# Patient Record
Sex: Male | Born: 1935 | Race: Black or African American | Hispanic: No | Marital: Married | Smoking: Former smoker
Health system: Southern US, Community
[De-identification: ages and names within clinical notes are randomized; demographics above are authoritative.]

## PROBLEM LIST (undated history)

## (undated) DIAGNOSIS — F0390 Unspecified dementia without behavioral disturbance: Secondary | ICD-10-CM

## (undated) DIAGNOSIS — F419 Anxiety disorder, unspecified: Secondary | ICD-10-CM

## (undated) DIAGNOSIS — K259 Gastric ulcer, unspecified as acute or chronic, without hemorrhage or perforation: Secondary | ICD-10-CM

## (undated) DIAGNOSIS — I10 Essential (primary) hypertension: Secondary | ICD-10-CM

## (undated) HISTORY — DX: Anxiety disorder, unspecified: F41.9

## (undated) HISTORY — PX: NO PAST SURGERIES: SHX2092

## (undated) HISTORY — PX: INSERTION PROSTATE RADIATION SEED: SUR718

## (undated) HISTORY — PX: MOUTH SURGERY: SHX715

## (undated) HISTORY — DX: Gastric ulcer, unspecified as acute or chronic, without hemorrhage or perforation: K25.9

---

## 1998-07-09 ENCOUNTER — Inpatient Hospital Stay (HOSPITAL_COMMUNITY): Admission: EM | Admit: 1998-07-09 | Discharge: 1998-07-11 | Payer: Self-pay | Admitting: Emergency Medicine

## 1998-07-09 ENCOUNTER — Encounter: Payer: Self-pay | Admitting: Emergency Medicine

## 1999-03-17 ENCOUNTER — Encounter: Payer: Self-pay | Admitting: Internal Medicine

## 1999-03-17 ENCOUNTER — Ambulatory Visit (HOSPITAL_COMMUNITY): Admission: RE | Admit: 1999-03-17 | Discharge: 1999-03-17 | Payer: Self-pay | Admitting: Internal Medicine

## 2003-12-23 ENCOUNTER — Ambulatory Visit: Payer: Self-pay | Admitting: Internal Medicine

## 2004-12-03 ENCOUNTER — Ambulatory Visit: Payer: Self-pay | Admitting: Internal Medicine

## 2004-12-25 ENCOUNTER — Ambulatory Visit: Payer: Self-pay | Admitting: Internal Medicine

## 2005-02-22 ENCOUNTER — Ambulatory Visit: Payer: Self-pay | Admitting: Internal Medicine

## 2005-12-01 ENCOUNTER — Ambulatory Visit: Payer: Self-pay | Admitting: Internal Medicine

## 2006-03-02 ENCOUNTER — Ambulatory Visit: Payer: Self-pay | Admitting: Internal Medicine

## 2006-04-06 ENCOUNTER — Ambulatory Visit: Payer: Self-pay | Admitting: Internal Medicine

## 2006-04-11 ENCOUNTER — Ambulatory Visit: Payer: Self-pay | Admitting: Internal Medicine

## 2006-06-06 ENCOUNTER — Ambulatory Visit: Payer: Self-pay | Admitting: Internal Medicine

## 2006-11-28 ENCOUNTER — Ambulatory Visit: Payer: Self-pay | Admitting: Internal Medicine

## 2006-11-29 DIAGNOSIS — I10 Essential (primary) hypertension: Secondary | ICD-10-CM | POA: Insufficient documentation

## 2006-11-29 DIAGNOSIS — F411 Generalized anxiety disorder: Secondary | ICD-10-CM | POA: Insufficient documentation

## 2006-11-29 DIAGNOSIS — Z8711 Personal history of peptic ulcer disease: Secondary | ICD-10-CM

## 2006-11-29 DIAGNOSIS — Z8546 Personal history of malignant neoplasm of prostate: Secondary | ICD-10-CM | POA: Insufficient documentation

## 2006-11-29 DIAGNOSIS — M1990 Unspecified osteoarthritis, unspecified site: Secondary | ICD-10-CM | POA: Insufficient documentation

## 2006-11-29 DIAGNOSIS — Z860100 Personal history of colon polyps, unspecified: Secondary | ICD-10-CM | POA: Insufficient documentation

## 2006-11-29 DIAGNOSIS — N529 Male erectile dysfunction, unspecified: Secondary | ICD-10-CM

## 2006-11-29 DIAGNOSIS — G250 Essential tremor: Secondary | ICD-10-CM

## 2006-11-29 DIAGNOSIS — R413 Other amnesia: Secondary | ICD-10-CM | POA: Insufficient documentation

## 2006-11-29 DIAGNOSIS — G252 Other specified forms of tremor: Secondary | ICD-10-CM | POA: Insufficient documentation

## 2007-02-01 ENCOUNTER — Ambulatory Visit: Payer: Self-pay | Admitting: Internal Medicine

## 2007-02-01 DIAGNOSIS — K279 Peptic ulcer, site unspecified, unspecified as acute or chronic, without hemorrhage or perforation: Secondary | ICD-10-CM | POA: Insufficient documentation

## 2007-02-10 ENCOUNTER — Encounter: Payer: Self-pay | Admitting: Internal Medicine

## 2007-05-17 ENCOUNTER — Ambulatory Visit: Payer: Self-pay | Admitting: Internal Medicine

## 2007-05-21 LAB — CONVERTED CEMR LAB
ALT: 31 units/L (ref 0–53)
AST: 32 units/L (ref 0–37)
Albumin: 3.7 g/dL (ref 3.5–5.2)
Alkaline Phosphatase: 42 units/L (ref 39–117)
BUN: 12 mg/dL (ref 6–23)
Basophils Absolute: 0 10*3/uL (ref 0.0–0.1)
Basophils Relative: 0.4 % (ref 0.0–1.0)
Bilirubin, Direct: 0.3 mg/dL (ref 0.0–0.3)
CO2: 31 meq/L (ref 19–32)
Calcium: 9.2 mg/dL (ref 8.4–10.5)
Chloride: 107 meq/L (ref 96–112)
Creatinine, Ser: 1.2 mg/dL (ref 0.4–1.5)
Eosinophils Absolute: 0.1 10*3/uL (ref 0.0–0.7)
Eosinophils Relative: 1.1 % (ref 0.0–5.0)
GFR calc Af Amer: 74 mL/min
GFR calc non Af Amer: 61 mL/min
Glucose, Bld: 106 mg/dL — ABNORMAL HIGH (ref 70–99)
HCT: 49 % (ref 39.0–52.0)
Hemoglobin: 15.7 g/dL (ref 13.0–17.0)
Lymphocytes Relative: 35.2 % (ref 12.0–46.0)
MCHC: 32 g/dL (ref 30.0–36.0)
MCV: 91.6 fL (ref 78.0–100.0)
Monocytes Absolute: 0.7 10*3/uL (ref 0.1–1.0)
Monocytes Relative: 13.3 % — ABNORMAL HIGH (ref 3.0–12.0)
Neutro Abs: 2.4 10*3/uL (ref 1.4–7.7)
Neutrophils Relative %: 50 % (ref 43.0–77.0)
PSA: 0.03 ng/mL — ABNORMAL LOW (ref 0.10–4.00)
Platelets: 148 10*3/uL — ABNORMAL LOW (ref 150–400)
Potassium: 4.1 meq/L (ref 3.5–5.1)
RBC: 5.35 M/uL (ref 4.22–5.81)
RDW: 14.6 % (ref 11.5–14.6)
Sodium: 144 meq/L (ref 135–145)
TSH: 1.02 microintl units/mL (ref 0.35–5.50)
Total Bilirubin: 1.1 mg/dL (ref 0.3–1.2)
Total Protein: 6.9 g/dL (ref 6.0–8.3)
WBC: 4.9 10*3/uL (ref 4.5–10.5)

## 2007-05-22 ENCOUNTER — Ambulatory Visit: Payer: Self-pay | Admitting: Internal Medicine

## 2007-06-30 ENCOUNTER — Encounter: Payer: Self-pay | Admitting: Internal Medicine

## 2007-06-30 ENCOUNTER — Ambulatory Visit: Payer: Self-pay | Admitting: Vascular Surgery

## 2007-06-30 DIAGNOSIS — H349 Unspecified retinal vascular occlusion: Secondary | ICD-10-CM | POA: Insufficient documentation

## 2007-07-03 ENCOUNTER — Telehealth: Payer: Self-pay | Admitting: Internal Medicine

## 2007-07-05 ENCOUNTER — Ambulatory Visit: Payer: Self-pay

## 2007-07-05 ENCOUNTER — Encounter: Payer: Self-pay | Admitting: Internal Medicine

## 2007-07-11 ENCOUNTER — Telehealth: Payer: Self-pay | Admitting: Internal Medicine

## 2007-08-11 ENCOUNTER — Telehealth: Payer: Self-pay | Admitting: Internal Medicine

## 2007-08-31 ENCOUNTER — Ambulatory Visit: Payer: Self-pay | Admitting: Internal Medicine

## 2007-08-31 LAB — CONVERTED CEMR LAB
ALT: 24 units/L (ref 0–53)
AST: 32 units/L (ref 0–37)
Albumin: 3.6 g/dL (ref 3.5–5.2)
Alkaline Phosphatase: 56 units/L (ref 39–117)
BUN: 12 mg/dL (ref 6–23)
Basophils Absolute: 0 10*3/uL (ref 0.0–0.1)
Basophils Relative: 0.3 % (ref 0.0–3.0)
Bilirubin, Direct: 0.1 mg/dL (ref 0.0–0.3)
CO2: 30 meq/L (ref 19–32)
Calcium: 9 mg/dL (ref 8.4–10.5)
Chloride: 106 meq/L (ref 96–112)
Creatinine, Ser: 1.3 mg/dL (ref 0.4–1.5)
Eosinophils Absolute: 0.1 10*3/uL (ref 0.0–0.7)
Eosinophils Relative: 2.5 % (ref 0.0–5.0)
GFR calc Af Amer: 67 mL/min
GFR calc non Af Amer: 55 mL/min
Glucose, Bld: 118 mg/dL — ABNORMAL HIGH (ref 70–99)
HCT: 35.5 % — ABNORMAL LOW (ref 39.0–52.0)
Hemoglobin: 11.5 g/dL — ABNORMAL LOW (ref 13.0–17.0)
Lymphocytes Relative: 26.9 % (ref 12.0–46.0)
MCHC: 32.4 g/dL (ref 30.0–36.0)
MCV: 86.2 fL (ref 78.0–100.0)
Monocytes Absolute: 0.5 10*3/uL (ref 0.1–1.0)
Monocytes Relative: 11.2 % (ref 3.0–12.0)
Neutro Abs: 2.6 10*3/uL (ref 1.4–7.7)
Neutrophils Relative %: 59.1 % (ref 43.0–77.0)
PSA: 0.04 ng/mL — ABNORMAL LOW (ref 0.10–4.00)
Platelets: 270 10*3/uL (ref 150–400)
Potassium: 4.1 meq/L (ref 3.5–5.1)
RBC: 4.12 M/uL — ABNORMAL LOW (ref 4.22–5.81)
RDW: 15.9 % — ABNORMAL HIGH (ref 11.5–14.6)
Sodium: 143 meq/L (ref 135–145)
TSH: 0.98 microintl units/mL (ref 0.35–5.50)
Total Bilirubin: 0.7 mg/dL (ref 0.3–1.2)
Total Protein: 6.6 g/dL (ref 6.0–8.3)
WBC: 4.4 10*3/uL — ABNORMAL LOW (ref 4.5–10.5)

## 2007-09-06 ENCOUNTER — Ambulatory Visit: Payer: Self-pay | Admitting: Internal Medicine

## 2007-12-04 ENCOUNTER — Ambulatory Visit: Payer: Self-pay | Admitting: Internal Medicine

## 2008-01-15 ENCOUNTER — Ambulatory Visit: Payer: Self-pay | Admitting: Internal Medicine

## 2008-01-15 DIAGNOSIS — F329 Major depressive disorder, single episode, unspecified: Secondary | ICD-10-CM

## 2008-01-19 LAB — CONVERTED CEMR LAB
BUN: 13 mg/dL (ref 6–23)
Basophils Absolute: 0 10*3/uL (ref 0.0–0.1)
Basophils Relative: 0 % (ref 0.0–3.0)
CO2: 31 meq/L (ref 19–32)
Calcium: 9.3 mg/dL (ref 8.4–10.5)
Chloride: 105 meq/L (ref 96–112)
Creatinine, Ser: 1.3 mg/dL (ref 0.4–1.5)
Eosinophils Absolute: 0.1 10*3/uL (ref 0.0–0.7)
Eosinophils Relative: 2.1 % (ref 0.0–5.0)
GFR calc Af Amer: 67 mL/min
GFR calc non Af Amer: 55 mL/min
Glucose, Bld: 88 mg/dL (ref 70–99)
HCT: 43.6 % (ref 39.0–52.0)
Hemoglobin: 14.3 g/dL (ref 13.0–17.0)
Lymphocytes Relative: 29.6 % (ref 12.0–46.0)
MCHC: 32.8 g/dL (ref 30.0–36.0)
MCV: 81.3 fL (ref 78.0–100.0)
Monocytes Absolute: 0.8 10*3/uL (ref 0.1–1.0)
Monocytes Relative: 13 % — ABNORMAL HIGH (ref 3.0–12.0)
Neutro Abs: 3.3 10*3/uL (ref 1.4–7.7)
Neutrophils Relative %: 55.3 % (ref 43.0–77.0)
PSA: 0.06 ng/mL — ABNORMAL LOW (ref 0.10–4.00)
Platelets: 194 10*3/uL (ref 150–400)
Potassium: 4.3 meq/L (ref 3.5–5.1)
RBC: 5.36 M/uL (ref 4.22–5.81)
RDW: 23.8 % — ABNORMAL HIGH (ref 11.5–14.6)
Sodium: 143 meq/L (ref 135–145)
TSH: 1.32 microintl units/mL (ref 0.35–5.50)
WBC: 6 10*3/uL (ref 4.5–10.5)

## 2008-07-12 ENCOUNTER — Encounter: Payer: Self-pay | Admitting: Internal Medicine

## 2008-08-07 ENCOUNTER — Telehealth: Payer: Self-pay | Admitting: Internal Medicine

## 2008-08-22 ENCOUNTER — Telehealth: Payer: Self-pay | Admitting: Internal Medicine

## 2008-08-29 ENCOUNTER — Encounter: Payer: Self-pay | Admitting: Internal Medicine

## 2008-09-12 ENCOUNTER — Telehealth: Payer: Self-pay | Admitting: Internal Medicine

## 2008-11-08 ENCOUNTER — Telehealth: Payer: Self-pay | Admitting: Internal Medicine

## 2008-11-08 ENCOUNTER — Encounter: Payer: Self-pay | Admitting: Internal Medicine

## 2008-12-18 ENCOUNTER — Telehealth: Payer: Self-pay | Admitting: Internal Medicine

## 2009-05-12 ENCOUNTER — Telehealth: Payer: Self-pay | Admitting: Internal Medicine

## 2009-05-19 ENCOUNTER — Ambulatory Visit: Payer: Self-pay | Admitting: Internal Medicine

## 2009-05-19 LAB — CONVERTED CEMR LAB
ALT: 33 units/L (ref 0–53)
AST: 33 units/L (ref 0–37)
Albumin: 3.8 g/dL (ref 3.5–5.2)
Alkaline Phosphatase: 64 units/L (ref 39–117)
BUN: 14 mg/dL (ref 6–23)
Basophils Absolute: 0 10*3/uL (ref 0.0–0.1)
Basophils Relative: 0.4 % (ref 0.0–3.0)
Bilirubin, Direct: 0.2 mg/dL (ref 0.0–0.3)
CO2: 32 meq/L (ref 19–32)
Calcium: 9.2 mg/dL (ref 8.4–10.5)
Chloride: 104 meq/L (ref 96–112)
Creatinine, Ser: 1.3 mg/dL (ref 0.4–1.5)
Eosinophils Absolute: 0.1 10*3/uL (ref 0.0–0.7)
Eosinophils Relative: 1.7 % (ref 0.0–5.0)
GFR calc non Af Amer: 66.7 mL/min (ref >60)
Glucose, Bld: 90 mg/dL (ref 70–99)
HCT: 49.3 % (ref 39.0–52.0)
Hemoglobin, Urine: NEGATIVE
Hemoglobin: 16.6 g/dL (ref 13.0–17.0)
Ketones, ur: NEGATIVE mg/dL
Leukocytes, UA: NEGATIVE
Lymphocytes Relative: 23.8 % (ref 12.0–46.0)
Lymphs Abs: 1.7 10*3/uL (ref 0.7–4.0)
MCHC: 33.7 g/dL (ref 30.0–36.0)
MCV: 93.6 fL (ref 78.0–100.0)
Monocytes Absolute: 1 10*3/uL (ref 0.1–1.0)
Monocytes Relative: 14.9 % — ABNORMAL HIGH (ref 3.0–12.0)
Neutro Abs: 4.1 10*3/uL (ref 1.4–7.7)
Neutrophils Relative %: 59.2 % (ref 43.0–77.0)
Nitrite: NEGATIVE
PSA: 0.05 ng/mL — ABNORMAL LOW (ref 0.10–4.00)
Platelets: 179 10*3/uL (ref 150.0–400.0)
Potassium: 4.2 meq/L (ref 3.5–5.1)
RBC: 5.27 M/uL (ref 4.22–5.81)
RDW: 16.3 % — ABNORMAL HIGH (ref 11.5–14.6)
Sed Rate: 3 mm/hr (ref 0–22)
Sodium: 144 meq/L (ref 135–145)
Specific Gravity, Urine: >=1.03 (ref 1.000–1.030)
TSH: 1.71 microintl units/mL (ref 0.35–5.50)
Total Bilirubin: 0.8 mg/dL (ref 0.3–1.2)
Total Protein: 7.1 g/dL (ref 6.0–8.3)
Urine Glucose: NEGATIVE mg/dL
Urobilinogen, UA: 0.2 (ref 0.0–1.0)
Vitamin B-12: 489 pg/mL (ref 211–911)
WBC: 6.9 10*3/uL (ref 4.5–10.5)
pH: 5 (ref 5.0–8.0)

## 2009-05-21 LAB — CONVERTED CEMR LAB: Vit D, 25-Hydroxy: 10 ng/mL — ABNORMAL LOW (ref 30–89)

## 2009-07-22 ENCOUNTER — Ambulatory Visit: Payer: Self-pay | Admitting: Internal Medicine

## 2010-02-06 ENCOUNTER — Telehealth: Payer: Self-pay | Admitting: Internal Medicine

## 2010-03-19 NOTE — Progress Notes (Signed)
Phone Note Other Incoming   Summary of Call: Needs refills Initial call taken by: Tresa Garter MD,  February 06, 2010 11:28 AM  Follow-up for Phone Call        ok Follow-up by: Tresa Garter MD,  February 06, 2010 11:28 AM    Prescriptions: FLUOXETINE HCL 20 MG TABS (FLUOXETINE HCL) 2 by mouth qd  #180 x 3   Entered and Authorized by:   Tresa Garter MD   Signed by:   Tresa Garter MD on 02/06/2010   Method used:   Print then Give to Patient   RxID:   [id number] NAMENDA 10 MG TABS (MEMANTINE HCL) 1 by mouth bid  #180 x 3   Entered and Authorized by:   Tresa Garter MD   Signed by:   Tresa Garter MD on 02/06/2010   Method used:   Print then Give to Patient   RxID:   [id number] ASPIRIN 81 MG  TBEC (ASPIRIN) one by mouth every day  #100 x 3   Entered and Authorized by:   Tresa Garter MD   Signed by:   Tresa Garter MD on 02/06/2010   Method used:   Print then Give to Patient   RxID:   [id number] VITAMIN-B COMPLEX   TABS (B COMPLEX VITAMINS) 1 qd  #100 x 3   Entered and Authorized by:   Tresa Garter MD   Signed by:   Tresa Garter MD on 02/06/2010   Method used:   Print then Give to Patient   RxID:   [id number] VITAMIN D3 1000 UNIT  TABS (CHOLECALCIFEROL) 2 po once daily  #200 x 3   Entered and Authorized by:   Tresa Garter MD   Signed by:   Tresa Garter MD on 02/06/2010   Method used:   Print then Give to Patient   RxID:   [id number] CELEBREX 200 MG  CAPS (CELECOXIB) 1 by mouth once daily as needed arthritis  #180 x 1   Entered and Authorized by:   Tresa Garter MD   Signed by:   Tresa Garter MD on 02/06/2010   Method used:   Print then Give to Patient   RxID:   [id number] ALPRAZOLAM 0.25 MG TABS (ALPRAZOLAM) 1 two times a day as needed  #180 x 3   Entered and Authorized by:   Tresa Garter MD   Signed by:   Tresa Garter MD on  02/06/2010   Method used:   Print then Give to Patient   RxID:   [id number] EXELON 6 MG  CAPS (RIVASTIGMINE TARTRATE) 1 by mouth bid  #180 x 3   Entered and Authorized by:   Tresa Garter MD   Signed by:   Tresa Garter MD on 02/06/2010   Method used:   Print then Give to Patient   RxID:   [id number] PROPRANOLOL HCL 40 MG  TABS (PROPRANOLOL HCL) 1 by mouth bid  #180 x 3   Entered and Authorized by:   Tresa Garter MD   Signed by:   Tresa Garter MD on 02/06/2010   Method used:   Print then Give to Patient   RxID:   [id number] AZOR 5-20 MG  TABS (AMLODIPINE-OLMESARTAN) Take 1 tablet by mouth once a day  #90 x 3   Entered and Authorized by:   Tresa Garter MD   Signed by:   Georgina Quint  Plotnikov MD on 02/06/2010   Method used:   Print then Give to Patient   RxID:   [id number]

## 2010-03-19 NOTE — Progress Notes (Signed)
Summary: call  Phone Note Call from Patient Call back at [phone]   Summary of Call: Pt's daughter is req a call back regarding eval for FL-2 form Initial call taken by: Lamar Sprinkles, CMA,  May 12, 2009 4:04 PM  Follow-up for Phone Call        Pt's daughter is concerned about pt's ability to care for himself. Concerned about increasing aggitation and refusal to keep f/u apts. Scheduled for office visit for f/u.  Follow-up by: Lamar Sprinkles, CMA,  May 13, 2009 8:42 AM

## 2010-03-19 NOTE — Assessment & Plan Note (Signed)
Summary: f/u - discuss issues per daughter/SD   Vital Signs:  Patient profile:   77 year old male Height:      68 inches (172.72 cm) Weight:      159.13 pounds (72.33 kg) O2 Sat:      98 % on Room air Temp:     97.5 degrees F (36.39 degrees C) oral Pulse rate:   67 / minute BP sitting:   150 / 90  (left arm) Cuff size:   regular  Vitals Entered By: Lucious Groves (May 19, 2009 2:59 PM)  O2 Flow:  Room air CC: Discuss meds and therapy per daughter./kb Is Patient Diabetic? No Pain Assessment Patient in pain? no      Comments Patient currently is not on Vitamin B or D, needs prescription sent to pharmacy./kb   CC:  Discuss meds and therapy per daughter./kb.  History of Present Illness: Comes w/daughter: moody and depressed; angry; poor memory. F/u HTN, OA.He has been neglecting his house and lawn.  Current Medications (verified): 1)  Azor 5-20 Mg  Tabs (Amlodipine-Olmesartan) .... Take 1 Tablet By Mouth Once A Day 2)  Propranolol Hcl 40 Mg  Tabs (Propranolol Hcl) .Marland Kitchen.. 1 By Mouth Bid 3)  Exelon 6 Mg  Caps (Rivastigmine Tartrate) .Marland Kitchen.. 1 By Mouth Bid 4)  Alprazolam 0.25 Mg Tabs (Alprazolam) .Marland Kitchen.. 1 Two Times A Day As Needed 5)  Celebrex 200 Mg  Caps (Celecoxib) .Marland Kitchen.. 1 By Mouth Once Daily As Needed Arthritis 6)  Vitamin D3 1000 Unit  Tabs (Cholecalciferol) .Marland Kitchen.. 1 Qd 7)  Vitamin-B Complex   Tabs (B Complex Vitamins) .Marland Kitchen.. 1 Qd 8)  Aspirin 81 Mg  Tbec (Aspirin) .... One By Mouth Every Day 9)  Namenda 10 Mg Tabs (Memantine Hcl) .Marland Kitchen.. 1 By Mouth Bid 10)  Fluoxetine Hcl 10 Mg Tabs (Fluoxetine Hcl) .... Take One Tablet By Mouth Daily  Allergies (verified): No Known Drug Allergies  Past History:  Past Medical History: Last updated: 01/15/2008 Anxiety Prostate cancer, hx of   s/p XRT seeds Dr Vernie Ammons Colonic polyps, hx of Hypertension Osteoarthritis Tremor Memory loss ED Peptic ulcer disease Depression  Family History: Last updated: 02/01/2007 Family History  Hypertension  Social History: Last updated: 02/01/2007 Retired Married Never Smoked  Past Surgical History: Denies surgical history Prostate XRT seeds  Review of Systems       The patient complains of depression.  The patient denies anorexia, fever, weight loss, weight gain, chest pain, syncope, dyspnea on exertion, prolonged cough, hemoptysis, muscle weakness, and difficulty walking.    Physical Exam  General:  NAD Head:  Normocephalic and atraumatic without obvious abnormalities. No apparent alopecia or balding. Ears:  External ear exam shows no significant lesions or deformities.  Otoscopic examination reveals clear canals, tympanic membranes are intact bilaterally without bulging, retraction, inflammation or discharge. Hearing is grossly normal bilaterally. Nose:  External nasal examination shows no deformity or inflammation. Nasal mucosa are pink and moist without lesions or exudates. Mouth:  Oral mucosa and oropharynx without lesions or exudates.  Teeth in good repair. Neck:  No deformities, masses, or tenderness noted. Lungs:  CTA Heart:  RRR Abdomen:  Bowel sounds positive,abdomen soft and non-tender without masses, organomegaly or hernias noted. Msk:  No deformity or scoliosis noted of thoracic or lumbar spine.   Extremities:  No clubbing, cyanosis, edema, or deformity noted with normal full range of motion of all joints.   Neurologic:  alert & oriented X3 and gait normal.  Mild tremor  Skin:  Intact without suspicious lesions or rashes Psych:  Oriented X2 and depressed affect.  normally interactive, good eye contact, not suicidal, not homicidal, depressed affect, poor concentration, memory impairment, and judgment poor.     Impression & Recommendations:  Problem # 1:  DEPRESSION (ICD-311) Assessment Deteriorated See "Patient Instructions".  The following medications were removed from the medication list:    Fluoxetine Hcl 10 Mg Tabs (Fluoxetine hcl) .Marland Kitchen... Take one  tablet by mouth daily His updated medication list for this problem includes:    Alprazolam 0.25 Mg Tabs (Alprazolam) .Marland Kitchen... 1 two times a day as needed    Fluoxetine Hcl 20 Mg Tabs (Fluoxetine hcl) .Marland Kitchen... 2 by mouth qd The office visit took longer than 45 min with patient councelling for more than 50% of the 45 min We discussed the need to move to Senior Housing Orders: T-Vitamin D (25-Hydroxy) [phone]) TLB-B12, Serum-Total ONLY (25366-Y40) TLB-BMP (Basic Metabolic Panel-BMET) (80048-METABOL) TLB-CBC Platelet - w/Differential (85025-CBCD) TLB-Hepatic/Liver Function Pnl (80076-HEPATIC) TLB-Sedimentation Rate (ESR) (85652-ESR) TLB-TSH (Thyroid Stimulating Hormone) (84443-TSH) TLB-PSA (Prostate Specific Antigen) (84153-PSA) TLB-Udip ONLY (81003-UDIP)  Problem # 2:  TREMOR, ESSENTIAL (ICD-333.1) Assessment: Unchanged  Orders: T-Vitamin D (25-Hydroxy) [phone]) TLB-B12, Serum-Total ONLY (87564-P32) TLB-BMP (Basic Metabolic Panel-BMET) (80048-METABOL) TLB-CBC Platelet - w/Differential (85025-CBCD) TLB-Hepatic/Liver Function Pnl (80076-HEPATIC) TLB-Sedimentation Rate (ESR) (85652-ESR) TLB-TSH (Thyroid Stimulating Hormone) (84443-TSH) TLB-PSA (Prostate Specific Antigen) (84153-PSA) TLB-Udip ONLY (81003-UDIP)  Problem # 3:  HYPERTENSION (ICD-401.9) Assessment: Unchanged  His updated medication list for this problem includes:    Azor 5-20 Mg Tabs (Amlodipine-olmesartan) .Marland Kitchen... Take 1 tablet by mouth once a day    Propranolol Hcl 40 Mg Tabs (Propranolol hcl) .Marland Kitchen... 1 by mouth bid  BP today: 150/90 Prior BP: 160/94 (01/15/2008)  Labs Reviewed: K+: 4.3 (01/15/2008) Creat: : 1.3 (01/15/2008)     Orders: T-Vitamin D (25-Hydroxy) (95188-41660) TLB-B12, Serum-Total ONLY (63016-W10) TLB-BMP (Basic Metabolic Panel-BMET) (80048-METABOL) TLB-CBC Platelet - w/Differential (85025-CBCD) TLB-Hepatic/Liver Function Pnl (80076-HEPATIC) TLB-Sedimentation Rate (ESR) (85652-ESR) TLB-TSH  (Thyroid Stimulating Hormone) (84443-TSH) TLB-PSA (Prostate Specific Antigen) (84153-PSA) TLB-Udip ONLY (81003-UDIP)  Problem # 4:  SYMPTOM, MEMORY LOSS (ICD-780.93) Assessment: Deteriorated He needs to move to a senior appt housing  Problem # 5:  PROSTATE CANCER, HX OF (ICD-V10.46) Assessment: Comment Only  Orders: T-Vitamin D (25-Hydroxy) (93235-57322) TLB-B12, Serum-Total ONLY (02542-H06) TLB-BMP (Basic Metabolic Panel-BMET) (80048-METABOL) TLB-CBC Platelet - w/Differential (85025-CBCD) TLB-Hepatic/Liver Function Pnl (80076-HEPATIC) TLB-Sedimentation Rate (ESR) (85652-ESR) TLB-TSH (Thyroid Stimulating Hormone) (84443-TSH) TLB-PSA (Prostate Specific Antigen) (84153-PSA) TLB-Udip ONLY (81003-UDIP)  Problem # 6:  FTT Assessment: Deteriorated As above  Complete Medication List: 1)  Azor 5-20 Mg Tabs (Amlodipine-olmesartan) .... Take 1 tablet by mouth once a day 2)  Propranolol Hcl 40 Mg Tabs (Propranolol hcl) .Marland Kitchen.. 1 by mouth bid 3)  Exelon 6 Mg Caps (Rivastigmine tartrate) .Marland Kitchen.. 1 by mouth bid 4)  Alprazolam 0.25 Mg Tabs (Alprazolam) .Marland Kitchen.. 1 two times a day as needed 5)  Celebrex 200 Mg Caps (Celecoxib) .Marland Kitchen.. 1 by mouth once daily as needed arthritis 6)  Vitamin D3 1000 Unit Tabs (Cholecalciferol) .... 2 po once daily 7)  Vitamin-b Complex Tabs (B complex vitamins) .Marland Kitchen.. 1 qd 8)  Aspirin 81 Mg Tbec (Aspirin) .... One by mouth every day 9)  Namenda 10 Mg Tabs (Memantine hcl) .Marland Kitchen.. 1 by mouth bid 10)  Fluoxetine Hcl 20 Mg Tabs (Fluoxetine hcl) .... 2 by mouth qd  Patient Instructions: 1)  Please schedule a follow-up appointment in 2 months. Prescriptions: FLUOXETINE HCL 20 MG TABS (FLUOXETINE HCL) 2  by mouth qd  #180 x 3   Entered and Authorized by:   Tresa Garter MD   Signed by:   Tresa Garter MD on 05/19/2009   Method used:   Print then Give to Patient   RxID:   [id number] NAMENDA 10 MG TABS (MEMANTINE HCL) 1 by mouth bid  #180 x 3   Entered and Authorized  by:   Tresa Garter MD   Signed by:   Tresa Garter MD on 05/19/2009   Method used:   Print then Give to Patient   RxID:   [id number] ASPIRIN 81 MG  TBEC (ASPIRIN) one by mouth every day  #100 x 3   Entered and Authorized by:   Tresa Garter MD   Signed by:   Tresa Garter MD on 05/19/2009   Method used:   Print then Give to Patient   RxID:   [id number] VITAMIN-B COMPLEX   TABS (B COMPLEX VITAMINS) 1 qd  #100 x 3   Entered and Authorized by:   Tresa Garter MD   Signed by:   Tresa Garter MD on 05/19/2009   Method used:   Print then Give to Patient   RxID:   [id number] VITAMIN D3 1000 UNIT  TABS (CHOLECALCIFEROL) 1 qd  #100 x 3   Entered and Authorized by:   Tresa Garter MD   Signed by:   Tresa Garter MD on 05/19/2009   Method used:   Print then Give to Patient   RxID:   [id number] ALPRAZOLAM 0.25 MG TABS (ALPRAZOLAM) 1 two times a day as needed  #180 x 3   Entered and Authorized by:   Tresa Garter MD   Signed by:   Tresa Garter MD on 05/19/2009   Method used:   Print then Give to Patient   RxID:   [id number] EXELON 6 MG  CAPS (RIVASTIGMINE TARTRATE) 1 by mouth bid  #180 x 3   Entered and Authorized by:   Tresa Garter MD   Signed by:   Tresa Garter MD on 05/19/2009   Method used:   Print then Give to Patient   RxID:   [id number] PROPRANOLOL HCL 40 MG  TABS (PROPRANOLOL HCL) 1 by mouth bid  #180 x 3   Entered and Authorized by:   Tresa Garter MD   Signed by:   Tresa Garter MD on 05/19/2009   Method used:   Print then Give to Patient   RxID:   [id number] AZOR 5-20 MG  TABS (AMLODIPINE-OLMESARTAN) Take 1 tablet by mouth once a day  #90 x 3   Entered and Authorized by:   Tresa Garter MD   Signed by:   Tresa Garter MD on 05/19/2009   Method used:   Print then Give to Patient   RxID:   [id number] FLUOXETINE HCL 20 MG TABS  (FLUOXETINE HCL) 2 by mouth qd  #180 x 3   Entered and Authorized by:   Tresa Garter MD   Signed by:   Tresa Garter MD on 05/19/2009   Method used:   Print then Give to Patient   RxID:   [id number]

## 2010-06-30 NOTE — Consult Note (Signed)
NEW PATIENT CONSULTATION   Howard Freeman, Howard Freeman  DOB:  22-Oct-1917                                       06/30/2007  CHART#:[id number]   The patient presents today to rule out carotid disease after he suffered  a right retinal embolus.  He is an extremely healthy 77 year old  gentleman who had had right eye visual changes.  He saw Dr. Eustaquio Maize today  and on visualization of his retina, was found to have probable embolus  on the right eye.  He does have some vision remaining, but does have  some cloudiness in his right eye.  He specifically denies any prior  amaurosis fugax, transient ischemic attack, or stroke.  He has no  cardiac or diabetic history.  He does have history of hypertension.  He  does not smoke, or drink alcohol.   SOCIAL HISTORY:  He is married with two children.  He is retired.  He  does not smoke, having quit these 30 years ago.  He does have occasional  social alcohol consumption.   REVIEW OF SYSTEMS:  Is positive only for arthritic joint pain, muscle  pain, and nervousness.   ALLERGIES:  None.   MEDICATIONS:  Celebrex, propranolol, alprazolam, Azor, and Exelon.  He  does not take daily aspirin therapy.   PHYSICAL EXAM:  Well-developed, well-nourished black male appearing  younger than stated age of 89.  Blood pressure is 190/80 in his right  arm, his heart rate is 80, his respirations are 18.  His carotid  arteries are without bruits bilaterally.  He does have 2+ radial pulses.  He is grossly intact neurologically.   He underwent carotid duplex in our office today and this reveals  completely normal carotid arteries with no evidence of stenosis or  plaque bilaterally.  I discussed this at length with the patient and his  family present.  I explained that this essentially rules out carotid  disease as the etiology of this embolus.  I did discuss this with Dr.  Posey Rea, and that he will be scheduled for coronary evaluation with a  2-D  echocardiogram early next week to rule out embolic source.  He will  begin daily aspirin therapy and follow up with Dr. Posey Rea.   Larina Earthly, M.D.  Electronically Signed   TFE/MEDQ  D:  07/03/2007  T:  07/03/2007  Job:  [id number]   cc:   Dr. Sharman Crate. Plotnikov, MD

## 2010-06-30 NOTE — Procedures (Signed)
CAROTID DUPLEX EXAM   INDICATION:  Right retinal artery occlusion.   HISTORY:  Diabetes:  No.  Cardiac:  No.  Hypertension:  Yes.  Smoking:  No.  Previous Surgery:  No.  CV History:  Asymptomatic.  Amaurosis Fugax No, Paresthesias No, Hemiparesis No                                       RIGHT             LEFT  Brachial systolic pressure:         190               188  Brachial Doppler waveforms:         Biphasic          Biphasic  Vertebral direction of flow:        Antegrade         Antegrade  DUPLEX VELOCITIES (cm/sec)  CCA peak systolic                   61                70  ECA peak systolic                   41                53  ICA peak systolic                   42                45  ICA end diastolic                   15                16  PLAQUE MORPHOLOGY:                  None              None  PLAQUE AMOUNT:                      None              None  PLAQUE LOCATION:                    None              None   IMPRESSION:  1. No evidence of carotid stenosis bilaterally.  2. Note:  Bilateral high bifurcations.      ___________________________________________  Larina Earthly, M.D.   AS/MEDQ  D:  06/30/2007  T:  06/30/2007  Job:  [id number]

## 2010-11-12 ENCOUNTER — Telehealth: Payer: Self-pay | Admitting: *Deleted

## 2010-11-12 NOTE — Telephone Encounter (Signed)
Patient requesting RF of alprazolam.

## 2010-11-13 MED ORDER — ALPRAZOLAM 0.25 MG PO TABS: 0.2500 mg | ORAL_TABLET | Freq: Two times a day (BID) | ORAL | Status: AC | PRN

## 2010-11-13 NOTE — Telephone Encounter (Signed)
Called in.

## 2010-11-13 NOTE — Telephone Encounter (Signed)
OK to fill this prescription with additional refills x0 Needs OV Thank you!  

## 2011-06-16 ENCOUNTER — Telehealth: Payer: Self-pay | Admitting: Internal Medicine
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
## 2012-04-22 ENCOUNTER — Encounter (HOSPITAL_COMMUNITY): Payer: Self-pay | Admitting: Emergency Medicine

## 2012-04-22 ENCOUNTER — Emergency Department (HOSPITAL_COMMUNITY): Payer: Medicare Other

## 2012-04-22 ENCOUNTER — Emergency Department (HOSPITAL_COMMUNITY)
Admission: EM | Admit: 2012-04-22 | Discharge: 2012-04-22 | Disposition: A | Discharge status: Home / Self Care | Payer: Medicare Other | Attending: Emergency Medicine | Admitting: Emergency Medicine

## 2012-04-22 DIAGNOSIS — I10 Essential (primary) hypertension: Secondary | ICD-10-CM

## 2012-04-22 DIAGNOSIS — F0390 Unspecified dementia without behavioral disturbance: Secondary | ICD-10-CM | POA: Insufficient documentation

## 2012-04-22 HISTORY — DX: Essential (primary) hypertension: I10

## 2012-04-22 HISTORY — DX: Unspecified dementia, unspecified severity, without behavioral disturbance, psychotic disturbance, mood disturbance, and anxiety: F03.90

## 2012-04-22 LAB — COMPREHENSIVE METABOLIC PANEL
ALT: 11 U/L (ref 0–53)
AST: 30 U/L (ref 0–37)
Albumin: 3.2 g/dL — ABNORMAL LOW (ref 3.5–5.2)
Alkaline Phosphatase: 62 U/L (ref 39–117)
BUN: 10 mg/dL (ref 6–23)
CO2: 25 mEq/L (ref 19–32)
Calcium: 8.7 mg/dL (ref 8.4–10.5)
Chloride: 99 mEq/L (ref 96–112)
Creatinine, Ser: 0.85 mg/dL (ref 0.50–1.35)
GFR calc Af Amer: 85 mL/min — ABNORMAL LOW (ref >90)
GFR calc non Af Amer: 73 mL/min — ABNORMAL LOW (ref >90)
Glucose, Bld: 86 mg/dL (ref 70–99)
Potassium: 3.8 mEq/L (ref 3.5–5.1)
Sodium: 139 mEq/L (ref 135–145)
Total Bilirubin: 0.6 mg/dL (ref 0.3–1.2)
Total Protein: 7.1 g/dL (ref 6.0–8.3)

## 2012-04-22 LAB — CBC
HCT: 46.8 % (ref 39.0–52.0)
Hemoglobin: 15.8 g/dL (ref 13.0–17.0)
MCH: 30 pg (ref 26.0–34.0)
MCHC: 33.8 g/dL (ref 30.0–36.0)
MCV: 89 fL (ref 78.0–100.0)
Platelets: 155 10*3/uL (ref 150–400)
RBC: 5.26 MIL/uL (ref 4.22–5.81)
RDW: 14.8 % (ref 11.5–15.5)
WBC: 5.5 10*3/uL (ref 4.0–10.5)

## 2012-04-22 LAB — RAPID URINE DRUG SCREEN, HOSP PERFORMED
Barbiturates: NOT DETECTED
Benzodiazepines: NOT DETECTED

## 2012-04-22 LAB — URINALYSIS, ROUTINE W REFLEX MICROSCOPIC
Glucose, UA: NEGATIVE mg/dL
Ketones, ur: 15 mg/dL — AB
Leukocytes, UA: NEGATIVE
Nitrite: NEGATIVE
Specific Gravity, Urine: 1.016 (ref 1.005–1.030)
pH: 6 (ref 5.0–8.0)

## 2012-04-22 LAB — ETHANOL: Alcohol, Ethyl (B): <11 mg/dL (ref 0–11)

## 2012-04-22 MED ORDER — HYDROCHLOROTHIAZIDE 12.5 MG PO CAPS
12.5000 mg | ORAL_CAPSULE | Freq: Once | ORAL | Status: AC
  Administered 2012-04-22: 12.5 mg via ORAL
  Filled 2012-04-22: qty 1

## 2012-04-22 MED ORDER — ALUM & MAG HYDROXIDE-SIMETH 200-200-20 MG/5ML PO SUSP: 30.0000 mL | ORAL | Status: DC | PRN

## 2012-04-22 MED ORDER — ACETAMINOPHEN 325 MG PO TABS: 650.0000 mg | ORAL_TABLET | ORAL | Status: DC | PRN

## 2012-04-22 MED ORDER — ONDANSETRON HCL 4 MG PO TABS: 4.0000 mg | ORAL_TABLET | Freq: Three times a day (TID) | ORAL | Status: DC | PRN
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
## 2012-04-22 NOTE — ED Notes (Signed)
EKG given to EDP, Otter, MD. 

## 2012-04-22 NOTE — Progress Notes (Signed)
Pharmacy - Brief Note  Pharmacy hasn't been able to verify the patient's home medications or allergies. We will continue to try to contact the patient's daughter.   I spoke with Dr. Ranae Palms and he is ok with proceeding with medications that have been ordered.  Charolotte Eke, PharmD, pager [phone]. 04/22/2012,10:39 AM.
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
## 2012-04-22 NOTE — ED Notes (Signed)
Unable to obtain information for past medical history , pt. Is confused.

## 2012-04-22 NOTE — ED Notes (Signed)
Right posterior forearm PIV d/cd; cath intact. Gauze and tape applied to site. Pt tolerated without complaints. Vwilliams,rn.
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
## 2012-04-22 NOTE — ED Notes (Signed)
Pt discharged to home. DC instructions given . No concerns voiced. Daughter/POA in at time of discharge and present when DC instructions given. Daughter voiced understanding and signed pt's discharge. Pt left unit in wheelchair pushed by nurse tech. Left in good condition. Vwilliams,rn.

## 2012-04-22 NOTE — ED Provider Notes (Signed)
Reviewed by social work. Pt's family given referral for outpatient NH placement. Does not meet inpt placement criteria.  Will go home with family. Pt is not acute danger to himself. No SI/HI.   Loren Racer, MD 04/22/12 [phone]

## 2012-04-22 NOTE — ED Notes (Signed)
Bed:WA05<BR> Expected date:<BR> Expected time:<BR> Means of arrival:<BR> Comments:<BR> EMS
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
## 2012-07-25 ENCOUNTER — Telehealth: Payer: Self-pay | Admitting: *Deleted

## 2012-07-25 NOTE — Telephone Encounter (Signed)
No

## 2012-07-25 NOTE — Telephone Encounter (Signed)
Pt's daughter Steward Drone states she dropped off some forms from [place] for pt. She states she is trying to get him out of his home and "in with the [place]" and the man there told her the PCP's comments on the form strongly determines whether they can place the pt. Have you seen these forms?

## 2012-07-27 DIAGNOSIS — Z0279 Encounter for issue of other medical certificate: Secondary | ICD-10-CM

## 2012-07-27 NOTE — Telephone Encounter (Signed)
Howard Freeman, front desk scheduler is working on them.

## 2012-07-28 ENCOUNTER — Ambulatory Visit (INDEPENDENT_AMBULATORY_CARE_PROVIDER_SITE_OTHER): Payer: Medicare Other | Admitting: Internal Medicine

## 2012-07-28 DIAGNOSIS — F0280 Dementia in other diseases classified elsewhere without behavioral disturbance: Secondary | ICD-10-CM

## 2012-07-28 DIAGNOSIS — G309 Alzheimer's disease, unspecified: Secondary | ICD-10-CM

## 2012-07-28 NOTE — Progress Notes (Signed)
Patient ID: Howard Freeman, male   DOB: Feb 04, 1918, 79 y.o.   MRN: [id number]  Mr Ostermann's dtr Steward Drone is here. Mr Sellitto refused to leave his house today and come for an appointment. He ha been hostile, wouldn't change his clothes. He has been confused more... He has been having more issues with OA, stooped over posture, ataxia. He has been home bound. Dx: Alzheimer's, Gait disorder, Ataxia  I'll fill out the form for [place]

## 2012-07-28 NOTE — Assessment & Plan Note (Signed)
He has been home bound [place] form filled out

## 2012-08-03 ENCOUNTER — Emergency Department (HOSPITAL_COMMUNITY): Payer: Medicare Other

## 2012-08-03 ENCOUNTER — Encounter (HOSPITAL_COMMUNITY): Payer: Self-pay | Admitting: Emergency Medicine

## 2012-08-03 ENCOUNTER — Inpatient Hospital Stay (HOSPITAL_COMMUNITY)
Admission: EM | Admit: 2012-08-03 | Discharge: 2012-08-08 | DRG: 682 | Disposition: A | Discharge status: Skilled Nursing Facility | Payer: Medicare Other | Attending: Internal Medicine | Admitting: Internal Medicine

## 2012-08-03 DIAGNOSIS — N289 Disorder of kidney and ureter, unspecified: Secondary | ICD-10-CM

## 2012-08-03 DIAGNOSIS — R627 Adult failure to thrive: Secondary | ICD-10-CM | POA: Diagnosis present

## 2012-08-03 DIAGNOSIS — R259 Unspecified abnormal involuntary movements: Secondary | ICD-10-CM | POA: Diagnosis present

## 2012-08-03 DIAGNOSIS — Z8546 Personal history of malignant neoplasm of prostate: Secondary | ICD-10-CM

## 2012-08-03 DIAGNOSIS — F411 Generalized anxiety disorder: Secondary | ICD-10-CM | POA: Diagnosis present

## 2012-08-03 DIAGNOSIS — F03918 Unspecified dementia, unspecified severity, with other behavioral disturbance: Secondary | ICD-10-CM | POA: Diagnosis present

## 2012-08-03 DIAGNOSIS — Z87891 Personal history of nicotine dependence: Secondary | ICD-10-CM

## 2012-08-03 DIAGNOSIS — E870 Hyperosmolality and hypernatremia: Secondary | ICD-10-CM | POA: Diagnosis present

## 2012-08-03 DIAGNOSIS — IMO0002 Reserved for concepts with insufficient information to code with codable children: Secondary | ICD-10-CM

## 2012-08-03 DIAGNOSIS — F329 Major depressive disorder, single episode, unspecified: Secondary | ICD-10-CM

## 2012-08-03 DIAGNOSIS — F0391 Unspecified dementia with behavioral disturbance: Secondary | ICD-10-CM | POA: Diagnosis present

## 2012-08-03 DIAGNOSIS — F0280 Dementia in other diseases classified elsewhere without behavioral disturbance: Secondary | ICD-10-CM | POA: Diagnosis present

## 2012-08-03 DIAGNOSIS — Z781 Physical restraint status: Secondary | ICD-10-CM | POA: Diagnosis present

## 2012-08-03 DIAGNOSIS — G309 Alzheimer's disease, unspecified: Secondary | ICD-10-CM | POA: Diagnosis present

## 2012-08-03 DIAGNOSIS — N179 Acute kidney failure, unspecified: Secondary | ICD-10-CM

## 2012-08-03 DIAGNOSIS — M1990 Unspecified osteoarthritis, unspecified site: Secondary | ICD-10-CM | POA: Diagnosis present

## 2012-08-03 DIAGNOSIS — E43 Unspecified severe protein-calorie malnutrition: Secondary | ICD-10-CM | POA: Diagnosis present

## 2012-08-03 DIAGNOSIS — R197 Diarrhea, unspecified: Secondary | ICD-10-CM | POA: Diagnosis present

## 2012-08-03 DIAGNOSIS — R4182 Altered mental status, unspecified: Secondary | ICD-10-CM | POA: Diagnosis present

## 2012-08-03 DIAGNOSIS — F3289 Other specified depressive episodes: Secondary | ICD-10-CM | POA: Diagnosis present

## 2012-08-03 DIAGNOSIS — E860 Dehydration: Secondary | ICD-10-CM

## 2012-08-03 DIAGNOSIS — I10 Essential (primary) hypertension: Secondary | ICD-10-CM | POA: Diagnosis present

## 2012-08-03 DIAGNOSIS — Z8711 Personal history of peptic ulcer disease: Secondary | ICD-10-CM

## 2012-08-03 DIAGNOSIS — G250 Essential tremor: Secondary | ICD-10-CM | POA: Diagnosis present

## 2012-08-03 DIAGNOSIS — M159 Polyosteoarthritis, unspecified: Secondary | ICD-10-CM | POA: Diagnosis present

## 2012-08-03 DIAGNOSIS — G252 Other specified forms of tremor: Secondary | ICD-10-CM | POA: Diagnosis present

## 2012-08-03 LAB — TROPONIN I: Troponin I: <0.3 ng/mL (ref <0.30)

## 2012-08-03 LAB — CBC WITH DIFFERENTIAL/PLATELET
Basophils Absolute: 0 10*3/uL (ref 0.0–0.1)
Basophils Relative: 0 % (ref 0–1)
HCT: 49.8 % (ref 39.0–52.0)
MCHC: 33.9 g/dL (ref 30.0–36.0)
Monocytes Absolute: 1 10*3/uL (ref 0.1–1.0)
Neutro Abs: 7.5 10*3/uL (ref 1.7–7.7)
Platelets: 182 10*3/uL (ref 150–400)
RDW: 14.1 % (ref 11.5–15.5)

## 2012-08-03 LAB — COMPREHENSIVE METABOLIC PANEL
BUN: 65 mg/dL — ABNORMAL HIGH (ref 6–23)
CO2: 26 mEq/L (ref 19–32)
Calcium: 9.6 mg/dL (ref 8.4–10.5)
Creatinine, Ser: 2.24 mg/dL — ABNORMAL HIGH (ref 0.50–1.35)
GFR calc Af Amer: 27 mL/min — ABNORMAL LOW (ref >90)
GFR calc non Af Amer: 24 mL/min — ABNORMAL LOW (ref >90)
Glucose, Bld: 126 mg/dL — ABNORMAL HIGH (ref 70–99)
Total Protein: 7.6 g/dL (ref 6.0–8.3)

## 2012-08-03 LAB — POTASSIUM: Potassium: 3.8 mEq/L (ref 3.5–5.1)

## 2012-08-03 LAB — CK: Total CK: 358 U/L — ABNORMAL HIGH (ref 7–232)

## 2012-08-03 MED ORDER — SODIUM CHLORIDE 0.9 % IV SOLN
INTRAVENOUS | Status: DC
  Administered 2012-08-03: 125 mL/h via INTRAVENOUS

## 2012-08-03 MED ORDER — RIVASTIGMINE TARTRATE 3 MG PO CAPS
6.0000 mg | ORAL_CAPSULE | Freq: Two times a day (BID) | ORAL | Status: DC
  Administered 2012-08-04 – 2012-08-08 (×10): 6 mg via ORAL
  Filled 2012-08-03 (×11): qty 2

## 2012-08-03 MED ORDER — ALPRAZOLAM 0.25 MG PO TABS
0.2500 mg | ORAL_TABLET | Freq: Two times a day (BID) | ORAL | Status: DC | PRN
  Administered 2012-08-04 – 2012-08-07 (×2): 0.25 mg via ORAL
  Filled 2012-08-03 (×2): qty 1

## 2012-08-03 MED ORDER — MEMANTINE HCL 10 MG PO TABS
10.0000 mg | ORAL_TABLET | Freq: Two times a day (BID) | ORAL | Status: DC
  Administered 2012-08-04 – 2012-08-08 (×10): 10 mg via ORAL
  Filled 2012-08-03 (×11): qty 1

## 2012-08-03 MED ORDER — DEXTROSE-NACL 5-0.45 % IV SOLN
INTRAVENOUS | Status: AC
  Administered 2012-08-03: 23:00:00 via INTRAVENOUS

## 2012-08-03 MED ORDER — DEXTROSE-NACL 5-0.45 % IV SOLN
INTRAVENOUS | Status: DC
  Administered 2012-08-04: 01:00:00 via INTRAVENOUS

## 2012-08-03 MED ORDER — ENOXAPARIN SODIUM 30 MG/0.3ML ~~LOC~~ SOLN
30.0000 mg | Freq: Every day | SUBCUTANEOUS | Status: DC
  Administered 2012-08-04 – 2012-08-06 (×4): 30 mg via SUBCUTANEOUS
  Filled 2012-08-03 (×6): qty 0.3

## 2012-08-03 MED ORDER — SODIUM CHLORIDE 0.9 % IV BOLUS (SEPSIS)
500.0000 mL | Freq: Once | INTRAVENOUS | Status: AC
  Administered 2012-08-03: 500 mL via INTRAVENOUS

## 2012-08-03 MED ORDER — FLUOXETINE HCL 20 MG PO CAPS
40.0000 mg | ORAL_CAPSULE | Freq: Every day | ORAL | Status: DC
  Administered 2012-08-04 – 2012-08-08 (×6): 40 mg via ORAL
  Filled 2012-08-03 (×6): qty 2

## 2012-08-03 NOTE — ED Notes (Signed)
Per EMS report, patient lives at home with dementia, oriented to self and situation. Per EMS patient has been in bed for three days, which is not typical, and his ADLs have declined, and patient denies pain, N/V. EMS found diarrhea on the floor, on the patient, and on the sheets.

## 2012-08-03 NOTE — H&P (Signed)
History and Physical  Howard Freeman ZOX:[id number] DOB: 1917/12/25 DOA: 08/03/2012  Referring physician: Dr Rubin Payor PCP: Sonda Primes, MD   Chief Complaint: Altered mental state/failure to thrive  HPI:   Patient is 79 year old man with PMH most significant for alzheimer's dementia and HTN who lives alone. His family checked on him after 3 days and found him in pool of feces and decided to bring him to the hospital.  Patient denies any complaints to me except inability to eat anything as he is not hungry anymore.   He denied chest pain, shortness of breath, problems with urination, changes in bowel movements or weakness in any part of the body.  15 point review of system is negative except what is noted above in the HPI.   Past Medical History  Diagnosis Date  . Dementia   . Hypertension     Past Surgical History  Procedure Laterality Date  . No past surgeries      Social History:  reports that he quit smoking about 69 years ago. His smoking use included Cigarettes. He smoked 0.00 packs per day. He has never used smokeless tobacco. He reports that he does not drink alcohol or use illicit drugs.  Not on File  History reviewed. No pertinent family history.   Prior to Admission medications   Medication Sig Start Date End Date Taking? Authorizing Provider  ALPRAZolam (XANAX) 0.25 MG tablet Take 0.25 mg by mouth 2 (two) times daily as needed for sleep (ANXIETY).    Historical Provider, MD  amLODipine-olmesartan (AZOR) 5-20 MG per tablet Take 1 tablet by mouth daily.    Historical Provider, MD  b complex vitamins tablet Take 1 tablet by mouth daily.    Historical Provider, MD  Cholecalciferol (VITAMIN D-3) 1000 UNITS CAPS Take 2,000 Units by mouth daily.    Historical Provider, MD  FLUoxetine (PROZAC) 20 MG tablet Take 40 mg by mouth daily.    Historical Provider, MD  memantine (NAMENDA) 10 MG tablet Take 10 mg by mouth 2 (two) times daily.    Historical Provider, MD   rivastigmine (EXELON) 6 MG capsule Take 6 mg by mouth 2 (two) times daily.    Historical Provider, MD   Physical Exam: Filed Vitals:   08/03/12 1853 08/03/12 1900 08/03/12 1900 08/03/12 2135  BP:   116/87 139/100  Pulse:  107  83  Temp:    97.5 F (36.4 C)  TempSrc:    Oral  Resp:  31  20  SpO2: 96% 95%  100%  Physical Exam: General: Vital signs reviewed and noted. Cachectic looking, in no acute distress; alert, appropriate and cooperative throughout examination.  Head: Normocephalic, atraumatic.  Eyes: PERRL, EOMI, No signs of anemia or jaundince.  Nose: Mucous membranes moist, not inflammed, nonerythematous.  Throat: Oropharynx nonerythematous, no exudate appreciated.   Neck: No deformities, masses, or tenderness noted.Supple, No carotid Bruits, no JVD.  Lungs:  Normal respiratory effort. Clear to auscultation BL without crackles or wheezes.  Heart: RRR. S1 and S2 normal without gallop, murmur, or rubs.  Abdomen:  BS normoactive. Soft, Nondistended, non-tender.  Palpable pulsatile aorta  Extremities: No pretibial edema. Poor care of toes in foot  Neurologic: A&O X3(patient was able to tell me his name, hospital and his address), CN II - XII are grossly intact. Motor strength is 5/5 in the all 4 extremities, Sensations intact to light touch, Cerebellar signs negative.  Skin: No visible rashes, scars.    Wt Readings from Last  3 Encounters:  05/19/09 159 lb 2.1 oz (72.181 kg)  01/15/08 164 lb (74.39 kg)  09/06/07 165 lb (74.844 kg)    Labs on Admission:  Basic Metabolic Panel:  Recent Labs Lab 08/03/12 1900 08/03/12 2019  NA 149*  --   K 5.6* 3.8  CL 106  --   CO2 26  --   GLUCOSE 126*  --   BUN 65*  --   CREATININE 2.24*  --   CALCIUM 9.6  --     Liver Function Tests:  Recent Labs Lab 08/03/12 1900  AST 35  ALT 20  ALKPHOS 54  BILITOT 0.7  PROT 7.6  ALBUMIN 3.3*   CBC:  Recent Labs Lab 08/03/12 1940  WBC 9.5  NEUTROABS 7.5  HGB 16.9  HCT 49.8   MCV 92.6  PLT 182    Cardiac Enzymes:  Recent Labs Lab 08/03/12 1900  CKTOTAL 358*  TROPONINI <0.30    Radiological Exams on Admission: Dg Chest 2 View  08/03/2012   *RADIOLOGY REPORT*  Clinical Data: Altered mental status  CHEST - 2 VIEW  Comparison: Prior chest x-ray 04/22/2012  Findings: No pulmonary edema, focal airspace consolidation, large effusion or pneumothorax.  The suggestion of patchy opacity in the right lung bases favored to reflect the overlying bedding.  Stable cardiac and mediastinal contours with ectatic and tortuous thoracic aorta.  Calcified mediastinal and right hilar nodes are again noted.  Central bronchitic changes are similar to prior.  No acute osseous abnormality.  IMPRESSION:  1.  No acute cardiopulmonary disease. 2.  Ectatic and highly tortuous thoracic aorta.   Original Report Authenticated By: Malachy Moan, M.D.   Ct Head Wo Contrast  08/03/2012   *RADIOLOGY REPORT*  Clinical Data: Demented, altered level of consciousness  CT HEAD WITHOUT CONTRAST  Technique:  Contiguous axial images were obtained from the base of the skull through the vertex without contrast.  Comparison: None.  Findings: Atherosclerotic and physiologic intracranial calcifications.  Encephalomalacia in right frontal white matter. Diffuse parenchymal atrophy. Patchy areas of hypoattenuation in deep and periventricular white matter bilaterally. Negative for acute intracranial hemorrhage, mass lesion, acute infarction, midline shift, or mass-effect. Acute infarct may be inapparent on noncontrast CT. Ventricles and sulci symmetric. Bone windows demonstrate no focal lesion.  IMPRESSION:  1. Negative for bleed or other acute intracranial process.  2. Atrophy and nonspecific white matter changes   Original Report Authenticated By: D. Andria Rhein, MD    EKG: Independently reviewed.  111bpm, sinus rhythm, left axis deviation, Q waves in anterior precordial leads may represent anteroseptal  infarct   Principal Problem:   Acute kidney injury Active Problems:   ANXIETY   DEPRESSION   TREMOR, ESSENTIAL   HYPERTENSION   OSTEOARTHRITIS   PROSTATE CANCER, HX OF   GASTRIC ULCER, HX OF   Alzheimer's disease   Failure to thrive in adult   Assessment/Plan Patient seems to be dehydrated from ongoing GI losses which is single most important factor for his renal injury. The dehydration is also worse due to ongoing inability to feed himself and hence failure to thrive. I think patient is unable to take care of himself at this time and I believe that admission will be complicated by social issues and placement. -IVF D51/2NS due to hypernatremia -repeat BMP in morning -Consult nutrition in AM -Check UA and urine microscopy -Follow PT and OT and consider short term/long term rehab after discussion with family -Hold antihypertensives -continue home medications  Code Status:  Full Family Communication: Updated at bedside Disposition Plan/Anticipated LOS: 1-2 days  Time spent: 55 minutes  Lars Mage, MD  [facility] Team 5  If 7PM-7AM, please contact night-coverage at [email or web address], password [facility] 08/03/2012, 10:38 PM

## 2012-08-03 NOTE — ED Notes (Signed)
Patient transported to X-ray 

## 2012-08-03 NOTE — ED Notes (Signed)
Pt's family member, daughter of Mr. Navy, Belay. Woodland left her contact numbers; Home: [phone].  Cell : [phone].  Please contact with disposition when possible.

## 2012-08-03 NOTE — ED Notes (Signed)
AVW:[id number]<XB> Expected date:<BR> Expected time:<BR> Means of arrival:<BR> Comments:<BR> ems- 79 yo. Lethargic, altered, diarrhea

## 2012-08-03 NOTE — ED Provider Notes (Signed)
History     CSN: [id number]  Arrival date & time 08/03/12  4098   First MD Initiated Contact with Patient 08/03/12 1838      Chief Complaint  Patient presents with  . Altered Mental Status   Level 5 caveat due to altered mental status  (Consider location/radiation/quality/duration/timing/severity/associated sxs/prior treatment) Patient is a 79 y.o. male presenting with altered mental status. The history is provided by the patient and the EMS personnel.  Altered Mental Status  patient was brought in for altered mental status. Reportedly has been in bed for 3 days. Family visited the patient at home. He has dementia but has a caregiver there. He was reportedly found in bed in his own diarrhea with diarrhea around the room. Patient states she's here because all the girls here wanted to see him.  Past Medical History  Diagnosis Date  . Dementia   . Hypertension     Past Surgical History  Procedure Laterality Date  . No past surgeries      History reviewed. No pertinent family history.  History  Substance Use Topics  . Smoking status: Former Smoker    Types: Cigarettes    Quit date: 08/04/1943  . Smokeless tobacco: Never Used  . Alcohol Use: No     Comment: "none in 2 to 3 years"      Review of Systems  Unable to perform ROS: Dementia  Psychiatric/Behavioral: Positive for altered mental status.    Allergies  Review of patient's allergies indicates not on file.  Home Medications   Current Outpatient Rx  Name  Route  Sig  Dispense  Refill  . ALPRAZolam (XANAX) 0.25 MG tablet   Oral   Take 0.25 mg by mouth 2 (two) times daily as needed for sleep (ANXIETY).         Marland Kitchen amLODipine-olmesartan (AZOR) 5-20 MG per tablet   Oral   Take 1 tablet by mouth daily.         Marland Kitchen b complex vitamins tablet   Oral   Take 1 tablet by mouth daily.         . Cholecalciferol (VITAMIN D-3) 1000 UNITS CAPS   Oral   Take 2,000 Units by mouth daily.         Marland Kitchen FLUoxetine  (PROZAC) 20 MG tablet   Oral   Take 40 mg by mouth daily.         . memantine (NAMENDA) 10 MG tablet   Oral   Take 10 mg by mouth 2 (two) times daily.         . rivastigmine (EXELON) 6 MG capsule   Oral   Take 6 mg by mouth 2 (two) times daily.           BP 116/87  Pulse 107  Temp(Src) 98.9 F (37.2 C) (Oral)  Resp 31  SpO2 95%  Physical Exam  Vitals reviewed. Constitutional: He appears well-developed.  catechtic  HENT:  Head: Normocephalic.  Eyes: Pupils are equal, round, and reactive to light.  Cardiovascular:  tachycardia  Pulmonary/Chest: Effort normal.  Abdominal: Soft.  Palpable pulsatile aorta  Musculoskeletal: He exhibits no edema.  Neurological: He is alert.  Some confusion   Skin: Skin is warm.    ED Course  Procedures (including critical care time)  Labs Reviewed  COMPREHENSIVE METABOLIC PANEL - Abnormal; Notable for the following:    Sodium 149 (*)    Potassium 5.6 (*)    Glucose, Bld 126 (*)    BUN  65 (*)    Creatinine, Ser 2.24 (*)    Albumin 3.3 (*)    GFR calc non Af Amer 24 (*)    GFR calc Af Amer 27 (*)    All other components within normal limits  CK - Abnormal; Notable for the following:    Total CK 358 (*)    All other components within normal limits  CBC WITH DIFFERENTIAL - Abnormal; Notable for the following:    Neutrophils Relative % 79 (*)    Lymphocytes Relative 11 (*)    All other components within normal limits  TROPONIN I  POTASSIUM  CBC WITH DIFFERENTIAL  URINALYSIS, ROUTINE W REFLEX MICROSCOPIC   Dg Chest 2 View  08/03/2012   *RADIOLOGY REPORT*  Clinical Data: Altered mental status  CHEST - 2 VIEW  Comparison: Prior chest x-ray 04/22/2012  Findings: No pulmonary edema, focal airspace consolidation, large effusion or pneumothorax.  The suggestion of patchy opacity in the right lung bases favored to reflect the overlying bedding.  Stable cardiac and mediastinal contours with ectatic and tortuous thoracic aorta.   Calcified mediastinal and right hilar nodes are again noted.  Central bronchitic changes are similar to prior.  No acute osseous abnormality.  IMPRESSION:  1.  No acute cardiopulmonary disease. 2.  Ectatic and highly tortuous thoracic aorta.   Original Report Authenticated By: Malachy Moan, M.D.   Ct Head Wo Contrast  08/03/2012   *RADIOLOGY REPORT*  Clinical Data: Demented, altered level of consciousness  CT HEAD WITHOUT CONTRAST  Technique:  Contiguous axial images were obtained from the base of the skull through the vertex without contrast.  Comparison: None.  Findings: Atherosclerotic and physiologic intracranial calcifications.  Encephalomalacia in right frontal white matter. Diffuse parenchymal atrophy. Patchy areas of hypoattenuation in deep and periventricular white matter bilaterally. Negative for acute intracranial hemorrhage, mass lesion, acute infarction, midline shift, or mass-effect. Acute infarct may be inapparent on noncontrast CT. Ventricles and sulci symmetric. Bone windows demonstrate no focal lesion.  IMPRESSION:  1. Negative for bleed or other acute intracranial process.  2. Atrophy and nonspecific white matter changes   Original Report Authenticated By: D. Andria Rhein, MD     1. Dehydration   2. Renal insufficiency     Date: 08/03/2012  Rate: 11  Rhythm: sinus tachycardia  QRS Axis: normal  Intervals: normal  ST/T Wave abnormalities: nonspecific ST/T changes  Conduction Disutrbances:none  Narrative Interpretation: LVH with Repol  Old EKG Reviewed: changes noted     MDM  Patient presents with increased confusion. His been admitted for 3 days. He appears dehydrated with a creatinine that is increased to 2.4 from 1. He'll be admitted to internal medicine.        Juliet Rude. Rubin Payor, MD 08/03/12 2132

## 2012-08-03 NOTE — ED Notes (Signed)
An in and out was attempt but no urine was returned a bladder scan was done and showed 117 mL in the bladder.  Nurse on 5E informed that UA was still pending.

## 2012-08-04 DIAGNOSIS — N179 Acute kidney failure, unspecified: Principal | ICD-10-CM

## 2012-08-04 DIAGNOSIS — E43 Unspecified severe protein-calorie malnutrition: Secondary | ICD-10-CM | POA: Diagnosis present

## 2012-08-04 DIAGNOSIS — G309 Alzheimer's disease, unspecified: Secondary | ICD-10-CM

## 2012-08-04 DIAGNOSIS — F0280 Dementia in other diseases classified elsewhere without behavioral disturbance: Secondary | ICD-10-CM

## 2012-08-04 LAB — BASIC METABOLIC PANEL
BUN: 55 mg/dL — ABNORMAL HIGH (ref 6–23)
Creatinine, Ser: 1.9 mg/dL — ABNORMAL HIGH (ref 0.50–1.35)
GFR calc Af Amer: 33 mL/min — ABNORMAL LOW (ref >90)
GFR calc non Af Amer: 29 mL/min — ABNORMAL LOW (ref >90)
Potassium: 4.2 mEq/L (ref 3.5–5.1)

## 2012-08-04 MED ORDER — ENSURE COMPLETE PO LIQD
237.0000 mL | Freq: Three times a day (TID) | ORAL | Status: DC
  Administered 2012-08-04 – 2012-08-05 (×4): 237 mL via ORAL

## 2012-08-04 MED ORDER — SODIUM CHLORIDE 0.9 % IV SOLN
INTRAVENOUS | Status: DC
  Administered 2012-08-04: 20:00:00 via INTRAVENOUS

## 2012-08-04 NOTE — Progress Notes (Signed)
INITIAL NUTRITION ASSESSMENT  DOCUMENTATION CODES Per approved criteria  -Severe Malnutrition in the context of chronic illness   Pt meets criteria for severe MALNUTRITION in the context of chronic illness as evidenced by nutrition-focused physical exam findings and nutritional intake reported as <75% of estimated needs for > 1 month.  Nutrition Focused Physical Exam:  Subcutaneous Fat:  Orbital Region: severe wasting Upper Arm Region: moderate wasting Thoracic and Lumbar Region: severe wasting  Muscle:  Temple Region: severe wasting Clavicle Bone Region: severe wasting Clavicle and Acromion Bone Region: severe wasting Scapular Bone Region: n/a Dorsal Hand: n/a Patellar Region: severe wasting Anterior Thigh Region: n/a Posterior Calf Region: severe wasting  Edema: none  INTERVENTION: 1. Ensure Complete po BID, each supplement provides 350 kcal and 13 grams of protein.  NUTRITION DIAGNOSIS: Inadequate oral intake related to AMS as evidenced by reported intake less than estimated needs.   Goal: Pt to meet >/= 90% of their estimated nutrition needs.  Monitor:  Po intake, weight  Reason for Assessment: Consult  79 y.o. male  Admitting Dx: Acute kidney injury  ASSESSMENT: Pt with past medical history of alzheimer's dementia and HTN who lives alone. After being found in his home in a pool of feces, pt was admitted for AMS/FTT.   Pt reported that he does not feel hungry, but that he eats because he knows that he should. He says that he gets Meals on Wheels at home and that he does eat daily. Suspect that he does not eat much when at home. He says that he has never used any nutritional supplements. Pt was given an Ensue Plus, and he consumed it during our visit. He said that he would like to continue Ensure Plus while in the hospital. He was also hungry and ready for his breakfast.    Height: Ht Readings from Last 1 Encounters:  05/19/09 5\' 8"  (1.727 m)    Weight: Wt  Readings from Last 1 Encounters:  05/19/09 159 lb 2.1 oz (72.181 kg)    Ideal Body Weight: 63.8 kg  % Ideal Body Weight: 87%  Wt Readings from Last 10 Encounters:  05/19/09 159 lb 2.1 oz (72.181 kg)  01/15/08 164 lb (74.39 kg)  09/06/07 165 lb (74.844 kg)  05/22/07 171 lb (77.565 kg)  02/01/07 168 lb (76.204 kg)    Usual Body Weight: unknown  % Usual Body Weight: unknown  BMI:  19.9, underweight for age over 42 yrs  Estimated Nutritional Needs: Kcal: 1700-1900 Protein: 80-90 g Fluid: 1.7-1.9 L  Skin: WNL  Diet Order: General  EDUCATION NEEDS: -No education needs identified at this time   Intake/Output Summary (Last 24 hours) at 08/04/12 1054 Last data filed at 08/04/12 1610  Gross per 24 hour  Intake 786.67 ml  Output    100 ml  Net 686.67 ml    Last BM: none recorded   Labs:   Recent Labs Lab 08/03/12 1900 08/03/12 2019 08/04/12 0520  NA 149*  --  147*  K 5.6* 3.8 4.2  CL 106  --  109  CO2 26  --  29  BUN 65*  --  55*  CREATININE 2.24*  --  1.90*  CALCIUM 9.6  --  8.9  GLUCOSE 126*  --  154*    CBG (last 3)  No results found for this basename: GLUCAP,  in the last 72 hours  Scheduled Meds: . enoxaparin (LOVENOX) injection  30 mg Subcutaneous QHS  . FLUoxetine  40 mg Oral  Daily  . memantine  10 mg Oral BID  . rivastigmine  6 mg Oral BID    Continuous Infusions: . dextrose 5 % and 0.45% NaCl 100 mL/hr at 08/04/12 0101    Past Medical History  Diagnosis Date  . Dementia   . Hypertension     Past Surgical History  Procedure Laterality Date  . No past surgeries      Ebbie Latus RD, LDN

## 2012-08-04 NOTE — Progress Notes (Signed)
[facility] PROGRESS NOTE  Howard Freeman ZOX:[id number] DOB: Jan 17, 1918 DOA: 08/03/2012 PCP: Sonda Primes, MD  Assessment/Plan: 1. ARF/Dehydration -due to poor PO intake -hold ARB -continue IVF -FU urine output  2. Dementia/Failure to thrive -continue namenda, rivastigmine  3. HTN -continue amliodipine  4. PCM-severe RD consult  Failure to thrive PT/OT  Code Status: FULL Family Communication: none at bedside Disposition Plan: likely needs SNF   HPI/Subjective: Doing well, no complaints  Objective: Filed Vitals:   08/03/12 2135 08/03/12 2205 08/04/12 0620 08/04/12 1000  BP: 139/100 144/86 158/84   Pulse: 83 71 88   Temp: 97.5 F (36.4 C) 97.6 F (36.4 C) 97.7 F (36.5 C)   TempSrc: Oral Oral Oral   Resp: 20 18 16    Height:    5\' 6"  (1.676 m)  Weight:    55.747 kg (122 lb 14.4 oz)  SpO2: 100% 99% 100%     Intake/Output Summary (Last 24 hours) at 08/04/12 1236 Last data filed at 08/04/12 8119  Gross per 24 hour  Intake 786.67 ml  Output    100 ml  Net 686.67 ml   Filed Weights   08/04/12 1000  Weight: 55.747 kg (122 lb 14.4 oz)    Exam:   General:  Alert, awake, oriented to self and partly to place  Cardiovascular: S1S2/RRR  Respiratory:CTAB  Abdomen: soft, NT, BS present  Musculoskeletal: no edema c/c   Data Reviewed: Basic Metabolic Panel:  Recent Labs Lab 08/03/12 1900 08/03/12 2019 08/04/12 0520  NA 149*  --  147*  K 5.6* 3.8 4.2  CL 106  --  109  CO2 26  --  29  GLUCOSE 126*  --  154*  BUN 65*  --  55*  CREATININE 2.24*  --  1.90*  CALCIUM 9.6  --  8.9   Liver Function Tests:  Recent Labs Lab 08/03/12 1900  AST 35  ALT 20  ALKPHOS 54  BILITOT 0.7  PROT 7.6  ALBUMIN 3.3*   No results found for this basename: LIPASE, AMYLASE,  in the last 168 hours No results found for this basename: AMMONIA,  in the last 168 hours CBC:  Recent Labs Lab 08/03/12 1940  WBC 9.5  NEUTROABS 7.5  HGB 16.9  HCT 49.8   MCV 92.6  PLT 182   Cardiac Enzymes:  Recent Labs Lab 08/03/12 1900  CKTOTAL 358*  TROPONINI <0.30   BNP (last 3 results) No results found for this basename: PROBNP,  in the last 8760 hours CBG: No results found for this basename: GLUCAP,  in the last 168 hours  No results found for this or any previous visit (from the past 240 hour(s)).   Studies: Dg Chest 2 View  08/03/2012   *RADIOLOGY REPORT*  Clinical Data: Altered mental status  CHEST - 2 VIEW  Comparison: Prior chest x-ray 04/22/2012  Findings: No pulmonary edema, focal airspace consolidation, large effusion or pneumothorax.  The suggestion of patchy opacity in the right lung bases favored to reflect the overlying bedding.  Stable cardiac and mediastinal contours with ectatic and tortuous thoracic aorta.  Calcified mediastinal and right hilar nodes are again noted.  Central bronchitic changes are similar to prior.  No acute osseous abnormality.  IMPRESSION:  1.  No acute cardiopulmonary disease. 2.  Ectatic and highly tortuous thoracic aorta.   Original Report Authenticated By: Malachy Moan, M.D.   Ct Head Wo Contrast  08/03/2012   *RADIOLOGY REPORT*  Clinical Data: Demented, altered level of  consciousness  CT HEAD WITHOUT CONTRAST  Technique:  Contiguous axial images were obtained from the base of the skull through the vertex without contrast.  Comparison: None.  Findings: Atherosclerotic and physiologic intracranial calcifications.  Encephalomalacia in right frontal white matter. Diffuse parenchymal atrophy. Patchy areas of hypoattenuation in deep and periventricular white matter bilaterally. Negative for acute intracranial hemorrhage, mass lesion, acute infarction, midline shift, or mass-effect. Acute infarct may be inapparent on noncontrast CT. Ventricles and sulci symmetric. Bone windows demonstrate no focal lesion.  IMPRESSION:  1. Negative for bleed or other acute intracranial process.  2. Atrophy and nonspecific white  matter changes   Original Report Authenticated By: D. Andria Rhein, MD    Scheduled Meds: . enoxaparin (LOVENOX) injection  30 mg Subcutaneous QHS  . feeding supplement  237 mL Oral TID BM  . FLUoxetine  40 mg Oral Daily  . memantine  10 mg Oral BID  . rivastigmine  6 mg Oral BID   Continuous Infusions: . sodium chloride      Principal Problem:   Acute kidney injury Active Problems:   ANXIETY   DEPRESSION   TREMOR, ESSENTIAL   HYPERTENSION   OSTEOARTHRITIS   PROSTATE CANCER, HX OF   GASTRIC ULCER, HX OF   Alzheimer's disease   Failure to thrive in adult   Protein-calorie malnutrition, severe    Time spent:    [facility]  [facility] Pager [phone]. If 7PM-7AM, please contact night-coverage at [email or web address], password [facility] 08/04/2012, 12:36 PM  LOS: 1 day

## 2012-08-04 NOTE — Evaluation (Signed)
Occupational Therapy Evaluation Patient Details Name: Howard Freeman MRN: [id number] DOB: 1917-02-17 Today's Date: 08/04/2012 Time: 4098-1191 OT Time Calculation (min): 24 min  OT Assessment / Plan / Recommendation Clinical Impression  Pt hospitalized after being found at home on floor in feces. Pt has dementia, FTT and dehydration. Pt would benefit from ST therapy at a SNF once d/c from acute care setting. No acute care OT indicated at this time. Pt to continue with acute PT services for functional mobility and safety. OT will sign off    OT Assessment  All further OT needs can be met in the next venue of care    Follow Up Recommendations  SNF    Barriers to Discharge  Pt lives at home alone    Equipment Recommendations  None recommended by OT    Recommendations for Other Services    Frequency       Precautions / Restrictions Precautions Precautions: Fall Precaution Comments: demenita, decreased safety awareness Restrictions Weight Bearing Restrictions: No       ADL  Grooming: Performed;Wash/dry hands;Wash/dry face;Min guard Where Assessed - Grooming: Supported standing Upper Body Bathing: Simulated;Supervision/safety;Set up Lower Body Bathing: Simulated;Min guard Upper Body Dressing: Performed;Supervision/safety;Set up Lower Body Dressing: Performed;Min guard Toilet Transfer: Performed;Min guard;Minimal assistance Toilet Transfer Method: Sit to Barista: Regular height toilet;Grab bars Toileting - Clothing Manipulation and Hygiene: Min guard Where Assessed - Toileting Clothing Manipulation and Hygiene: Standing Transfers/Ambulation Related to ADLs: cues for correct hand placement    OT Diagnosis: Generalized weakness  OT Problem List: Decreased strength;Decreased activity tolerance;Decreased safety awareness;Decreased cognition;Decreased knowledge of use of DME or AE;Impaired balance (sitting and/or standing);Decreased knowledge of  precautions OT Treatment Interventions:     OT Goals    Visit Information  Last OT Received On: 08/04/12 Assistance Needed: +1    Subjective Data  Subjective: " Since my wife dies, I have been going down ' Patient Stated Goal: To return home   Prior Functioning     Home Living Lives With: Alone Type of Home: House Bathroom Shower/Tub: Engineer, manufacturing systems: Standard Additional Comments:  - pt has dementia, . Pt not oriented to place/situation. Daughter reported by phone that pt lives alone, has assist for housekeeping. Has been refusing meds, MD appts, not eating. Daughter recently had knee surgery.  Prior Function Level of Independence: Needs assistance Needs Assistance: Light Housekeeping Driving: No Vocation: Retired Musician: No difficulties Dominant Hand: Right         Vision/Perception Vision - History Baseline Vision: Wears glasses only for reading Patient Visual Report: No change from baseline Perception Perception: Within Functional Limits   Cognition  Cognition Arousal/Alertness: Awake/alert Behavior During Therapy: WFL for tasks assessed/performed Overall Cognitive Status: No family/caregiver present to determine baseline cognitive functioning Area of Impairment: Memory;Safety/judgement;Awareness;Orientation Orientation Level: Disoriented to;Place;Time;Situation Memory: Decreased short-term memory Safety/Judgement: Decreased awareness of deficits;Decreased awareness of safety    Extremity/Trunk Assessment Right Upper Extremity Assessment RUE ROM/Strength/Tone: WFL for tasks assessed RUE Sensation: WFL - Light Touch RUE Coordination: WFL - gross/fine motor Left Upper Extremity Assessment LUE ROM/Strength/Tone: WFL for tasks assessed LUE Sensation: WFL - Light Touch LUE Coordination: WFL - gross/fine motor Trunk Assessment Trunk Assessment: Kyphotic     Mobility Bed Mobility Bed Mobility: Not  assessed Transfers Transfers: Sit to Stand;Stand to Sit Sit to Stand: From toilet;From chair/3-in-1;4: Min assist Stand to Sit: To toilet;To chair/3-in-1;4: Min assist Details for Transfer Assistance: min assist to rise     Exercise  Balance Balance Balance Assessed: No    End of Session OT - End of Session Equipment Utilized During Treatment: Gait belt Activity Tolerance: Patient tolerated treatment well Patient left: in chair;with call bell/phone within reach;with chair alarm set Nurse Communication: Mobility status  GO     Howard Freeman 08/04/2012, 11:39 AM

## 2012-08-04 NOTE — Evaluation (Addendum)
Physical Therapy Evaluation Patient Details Name: Howard Freeman MRN: [id number] DOB: Feb 18, 1917 Today's Date: 08/04/2012 Time: 4098-1191 PT Time Calculation (min): 23 min  PT Assessment / Plan / Recommendation Clinical Impression  79 y.o. male with h/o dementia found by family in pool of feces. Dx: dehydration, FTT. Unclear what prior functional level was, pt stated he walked without assistive device, daughter stated by phone that pt walked "bent over" prior to admission. Today he walked 37' with RW and min A. He is not oriented to time/situation/location. He would benefit from 24* assist/supervision at SNF level if family unable to provide this. Daughter reports pt has not been eating, refuses medications and MD appointments, and hasn't been showering at home. Acute PT will follow to address deficits with mobility.     PT Assessment  Patient needs continued PT services    Follow Up Recommendations  SNF;Supervision/Assistance - 24 hour    Does the patient have the potential to tolerate intense rehabilitation      Barriers to Discharge Decreased caregiver support      Equipment Recommendations  Rolling walker with 5" wheels    Recommendations for Other Services OT consult   Frequency Min 3X/week    Precautions / Restrictions Precautions Precautions: Fall Restrictions Weight Bearing Restrictions: No   Pertinent Vitals/Pain **pt denied pain*      Mobility  Bed Mobility Bed Mobility: Supine to Sit Supine to Sit: 6: Modified independent (Device/Increase time);With rails;HOB elevated Details for Bed Mobility Assistance: HOB 25* Transfers Transfers: Sit to Stand;Stand to Sit Sit to Stand: From bed;4: Min assist Stand to Sit: 4: Min guard;To chair/3-in-1 Details for Transfer Assistance: min assist to rise Ambulation/Gait Ambulation/Gait Assistance: 4: Min assist Ambulation Distance (Feet): 60 Feet Assistive device: Rolling walker Gait Pattern: Trunk flexed General Gait  Details: VCs to stand erect 2* flexed trunk/neck, noted pt reaching for walls/handrails so used RW though pt reported not using AD at home, min A to maneuver RW    Exercises     PT Diagnosis: Difficulty walking;Generalized weakness  PT Problem List: Decreased activity tolerance;Decreased cognition;Decreased knowledge of use of DME;Decreased safety awareness;Decreased mobility PT Treatment Interventions: Gait training;Functional mobility training;Therapeutic activities;Patient/family education   PT Goals Acute Rehab PT Goals PT Goal Formulation: Patient unable to participate in goal setting Time For Goal Achievement: 08/18/12 Potential to Achieve Goals: Fair Pt will go Sit to Stand: with supervision PT Goal: Sit to Stand - Progress: Goal set today Pt will Transfer Bed to Chair/Chair to Bed: with supervision PT Transfer Goal: Bed to Chair/Chair to Bed - Progress: Goal set today Pt will Ambulate: 51 - 150 feet;with supervision;with least restrictive assistive device PT Goal: Ambulate - Progress: Goal set today  Visit Information  Last PT Received On: 08/04/12 Assistance Needed: +1    Subjective Data  Subjective: I'm in the hospital? Patient Stated Goal: none stated   Prior Functioning  Home Living Lives With: Alone Additional Comments: Unconfirmed information - pt has dementia. Pt stated he lives alone and doesn't use assistive device. Pt not oriented to place/situation. Communication Communication: No difficulties    Cognition  Cognition Arousal/Alertness: Awake/alert Behavior During Therapy: WFL for tasks assessed/performed Overall Cognitive Status: No family/caregiver present to determine baseline cognitive functioning (noted h/o of dementia, unclear what baseline is)    Extremity/Trunk Assessment Right Upper Extremity Assessment RUE ROM/Strength/Tone: [facility] for tasks assessed Left Upper Extremity Assessment LUE ROM/Strength/Tone: WFL for tasks assessed Right Lower  Extremity Assessment RLE ROM/Strength/Tone: Within functional levels RLE  Sensation: WFL - Light Touch Left Lower Extremity Assessment LLE ROM/Strength/Tone: Within functional levels LLE Sensation: WFL - Light Touch Trunk Assessment Trunk Assessment: Kyphotic   Balance Balance Balance Assessed: Yes Static Sitting Balance Static Sitting - Balance Support: No upper extremity supported;Feet supported Static Sitting - Level of Assistance: 7: Independent Static Sitting - Comment/# of Minutes: 3  End of Session PT - End of Session Equipment Utilized During Treatment: Gait belt Activity Tolerance: Patient tolerated treatment well;Patient limited by fatigue Patient left: in chair;with call bell/phone within reach Nurse Communication: Mobility status  GP     Ralene Bathe Kistler 08/04/2012, 9:34 AM [phone]

## 2012-08-04 NOTE — Plan of Care (Signed)
Problem: Phase II Progression Outcomes Goal: Discharge plan established Recommend SNF for further therapy after acute care d/c     

## 2012-08-05 LAB — BASIC METABOLIC PANEL
BUN: 40 mg/dL — ABNORMAL HIGH (ref 6–23)
Creatinine, Ser: 1.45 mg/dL — ABNORMAL HIGH (ref 0.50–1.35)
GFR calc Af Amer: 46 mL/min — ABNORMAL LOW (ref >90)
GFR calc non Af Amer: 40 mL/min — ABNORMAL LOW (ref >90)

## 2012-08-05 LAB — URINALYSIS, ROUTINE W REFLEX MICROSCOPIC
Glucose, UA: NEGATIVE mg/dL
Ketones, ur: NEGATIVE mg/dL
Leukocytes, UA: NEGATIVE
Nitrite: NEGATIVE
Protein, ur: NEGATIVE mg/dL
Urobilinogen, UA: 0.2 mg/dL (ref 0.0–1.0)

## 2012-08-05 LAB — CBC
HCT: 45.7 % (ref 39.0–52.0)
MCH: 29.2 pg (ref 26.0–34.0)
MCHC: 31.5 g/dL (ref 30.0–36.0)
MCV: 92.7 fL (ref 78.0–100.0)
RDW: 14.3 % (ref 11.5–15.5)

## 2012-08-05 MED ORDER — HYDRALAZINE HCL 20 MG/ML IJ SOLN
5.0000 mg | Freq: Four times a day (QID) | INTRAMUSCULAR | Status: DC | PRN
  Administered 2012-08-05: 5 mg via INTRAVENOUS
  Filled 2012-08-05: qty 1

## 2012-08-05 MED ORDER — SODIUM CHLORIDE 0.45 % IV SOLN
INTRAVENOUS | Status: DC
  Administered 2012-08-05: 10:00:00 via INTRAVENOUS

## 2012-08-05 MED ORDER — LORAZEPAM 2 MG/ML IJ SOLN
0.5000 mg | Freq: Once | INTRAMUSCULAR | Status: AC
  Administered 2012-08-05: 0.5 mg via INTRAVENOUS
  Filled 2012-08-05: qty 1

## 2012-08-05 MED ORDER — RISPERIDONE 0.5 MG PO TABS
0.5000 mg | ORAL_TABLET | Freq: Every day | ORAL | Status: DC
  Administered 2012-08-05 – 2012-08-07 (×3): 0.5 mg via ORAL
  Filled 2012-08-05 (×5): qty 1

## 2012-08-05 MED ORDER — AMLODIPINE BESYLATE 10 MG PO TABS
10.0000 mg | ORAL_TABLET | Freq: Every day | ORAL | Status: DC
  Administered 2012-08-05 – 2012-08-08 (×4): 10 mg via ORAL
  Filled 2012-08-05 (×5): qty 1

## 2012-08-05 NOTE — Progress Notes (Addendum)
Pt very confused and has made several attempts to get out of the bed. Pt is attempting to go home and disorientated to the situation. MD notified and new orders initiated. Pt placed in soft waist restraint and soft mittens also placed on.

## 2012-08-05 NOTE — Progress Notes (Signed)
Pt calm and asleep. Restraints still in place.

## 2012-08-05 NOTE — Progress Notes (Signed)
Pt was given PRN xanax for anxiety at 2226. Medication did not help, pt was very restless and confused. Pt still made several attempts to get out of bed and was attempting to pull IV lines out. MD notified.

## 2012-08-05 NOTE — Progress Notes (Addendum)
[facility] PROGRESS NOTE  Howard Freeman ZOX:[id number] DOB: 08/08/1917 DOA: 08/03/2012 PCP: Sonda Primes, MD  Assessment/Plan: 1. ARF/Dehydration -due to poor PO intake -hold ARB -continue IVF, change to 1/2 NS today -FU urine output  2. Dementia/Failure to thrive now with agitation and sundowning -continue namenda, rivastigmine -add risperdone QPM  3. HTN -start amliodipine -hydralazine PRN  4. PCM-severe RD consult  5. Diarrhea: -check Cdiff PCR -could be malabsorption   Failure to thrive PT/OT  Code Status: FULL Family Communication: none at bedside Disposition Plan: likely needs SNF   HPI/Subjective: Very agitated overnight, pulling out IVs, foley  Objective: Filed Vitals:   08/04/12 2223 08/05/12 0600 08/05/12 0700 08/05/12 0813  BP: 152/90 160/97 166/94 162/118  Pulse:  66    Temp:  97.9 F (36.6 C)    TempSrc:  Axillary    Resp:  18    Height:      Weight:      SpO2:  95%      Intake/Output Summary (Last 24 hours) at 08/05/12 1027 Last data filed at 08/04/12 2200  Gross per 24 hour  Intake   1100 ml  Output    150 ml  Net    950 ml   Filed Weights   08/04/12 1000  Weight: 55.747 kg (122 lb 14.4 oz)    Exam:   General:  Alert, awake, oriented to self and partly to place  Cardiovascular: S1S2/RRR  Respiratory:CTAB  Abdomen: soft, NT, BS present  Musculoskeletal: no edema c/c   Data Reviewed: Basic Metabolic Panel:  Recent Labs Lab 08/03/12 1900 08/03/12 2019 08/04/12 0520 08/05/12 0550  NA 149*  --  147* 148*  K 5.6* 3.8 4.2 3.8  CL 106  --  109 113*  CO2 26  --  29 28  GLUCOSE 126*  --  154* 112*  BUN 65*  --  55* 40*  CREATININE 2.24*  --  1.90* 1.45*  CALCIUM 9.6  --  8.9 8.7   Liver Function Tests:  Recent Labs Lab 08/03/12 1900  AST 35  ALT 20  ALKPHOS 54  BILITOT 0.7  PROT 7.6  ALBUMIN 3.3*   No results found for this basename: LIPASE, AMYLASE,  in the last 168 hours No results found for  this basename: AMMONIA,  in the last 168 hours CBC:  Recent Labs Lab 08/03/12 1940 08/05/12 0550  WBC 9.5 8.7  NEUTROABS 7.5  --   HGB 16.9 14.4  HCT 49.8 45.7  MCV 92.6 92.7  PLT 182 140*   Cardiac Enzymes:  Recent Labs Lab 08/03/12 1900  CKTOTAL 358*  TROPONINI <0.30   BNP (last 3 results) No results found for this basename: PROBNP,  in the last 8760 hours CBG: No results found for this basename: GLUCAP,  in the last 168 hours  No results found for this or any previous visit (from the past 240 hour(s)).   Studies: Dg Chest 2 View  08/03/2012   *RADIOLOGY REPORT*  Clinical Data: Altered mental status  CHEST - 2 VIEW  Comparison: Prior chest x-ray 04/22/2012  Findings: No pulmonary edema, focal airspace consolidation, large effusion or pneumothorax.  The suggestion of patchy opacity in the right lung bases favored to reflect the overlying bedding.  Stable cardiac and mediastinal contours with ectatic and tortuous thoracic aorta.  Calcified mediastinal and right hilar nodes are again noted.  Central bronchitic changes are similar to prior.  No acute osseous abnormality.  IMPRESSION:  1.  No  acute cardiopulmonary disease. 2.  Ectatic and highly tortuous thoracic aorta.   Original Report Authenticated By: Malachy Moan, M.D.   Ct Head Wo Contrast  08/03/2012   *RADIOLOGY REPORT*  Clinical Data: Demented, altered level of consciousness  CT HEAD WITHOUT CONTRAST  Technique:  Contiguous axial images were obtained from the base of the skull through the vertex without contrast.  Comparison: None.  Findings: Atherosclerotic and physiologic intracranial calcifications.  Encephalomalacia in right frontal white matter. Diffuse parenchymal atrophy. Patchy areas of hypoattenuation in deep and periventricular white matter bilaterally. Negative for acute intracranial hemorrhage, mass lesion, acute infarction, midline shift, or mass-effect. Acute infarct may be inapparent on noncontrast CT.  Ventricles and sulci symmetric. Bone windows demonstrate no focal lesion.  IMPRESSION:  1. Negative for bleed or other acute intracranial process.  2. Atrophy and nonspecific white matter changes   Original Report Authenticated By: D. Andria Rhein, MD    Scheduled Meds: . amLODipine  10 mg Oral Daily  . enoxaparin (LOVENOX) injection  30 mg Subcutaneous QHS  . feeding supplement  237 mL Oral TID BM  . FLUoxetine  40 mg Oral Daily  . memantine  10 mg Oral BID  . risperiDONE  0.5 mg Oral Q2200  . rivastigmine  6 mg Oral BID   Continuous Infusions: . sodium chloride      Principal Problem:   Acute kidney injury Active Problems:   ANXIETY   DEPRESSION   TREMOR, ESSENTIAL   HYPERTENSION   OSTEOARTHRITIS   PROSTATE CANCER, HX OF   GASTRIC ULCER, HX OF   Alzheimer's disease   Failure to thrive in adult   Protein-calorie malnutrition, severe    Time spent:    [facility]  [facility] Pager [phone]. If 7PM-7AM, please contact night-coverage at [email or web address], password [facility] 08/05/2012, 10:27 AM  LOS: 2 days

## 2012-08-05 NOTE — Progress Notes (Signed)
Pt's bp elevated 166/94. MD notified, new orders written and initiated.

## 2012-08-05 NOTE — Progress Notes (Signed)
Clinical Social Work Department BRIEF PSYCHOSOCIAL ASSESSMENT 08/05/2012  Patient:  Howard Freeman, Howard Freeman     Account Number:  [id number]     Admit date:  08/03/2012  Clinical Social Worker:  Doroteo Glassman  Date/Time:  08/05/2012 12:59 PM  Referred by:  Physician  Date Referred:  08/05/2012 Referred for  SNF Placement   Other Referral:   Interview type:  Other - See comment Other interview type:   Pt's daughter, Mrs. Woodland    PSYCHOSOCIAL DATA Living Status:  ALONE Admitted from facility:   Level of care:   Primary support name:  Mrs. Woodland Primary support relationship to patient:  CHILD, ADULT Degree of support available:   strong    CURRENT CONCERNS Current Concerns  Post-Acute Placement   Other Concerns:    SOCIAL WORK ASSESSMENT / PLAN Spoke with Pt's daughter via phone.    Pt's daughter stated that she agrees with the SNF recommendation and gave permission for CSW to begin SNF search.  CSW discussed SNF search process.  Mrs. Woodland voiced an understanding.    CSW thanked Mrs. Woodland for her time.    CSW to leave SNF list in Pt's room for Mrs. Woodland's review.   Assessment/plan status:  Psychosocial Support/Ongoing Assessment of Needs Other assessment/ plan:   Information/referral to community resources:   SNF list    PATIENT'S/FAMILY'S RESPONSE TO PLAN OF CARE: Mrs. Woodland thanked CSW for time and assistance.   Providence Crosby, LCSWA Clinical Social Work [phone]

## 2012-08-05 NOTE — Progress Notes (Signed)
Clinical Social Work Department CLINICAL SOCIAL WORK PLACEMENT NOTE 08/05/2012  Patient:  Howard Freeman, Howard Freeman  Account Number:  [id number] Admit date:  08/03/2012  Clinical Social Worker:  Doroteo Glassman  Date/time:  08/05/2012 01:02 PM  Clinical Social Work is seeking post-discharge placement for this patient at the following level of care:   SKILLED NURSING   (*CSW will update this form in Epic as items are completed)   08/05/2012  Patient/family provided with [facility] Department of Clinical Social Work's list of facilities offering this level of care within the geographic area requested by the patient (or if unable, by the patient's family).  08/05/2012  Patient/family informed of their freedom to choose among providers that offer the needed level of care, that participate in Medicare, Medicaid or managed care program needed by the patient, have an available bed and are willing to accept the patient.  08/05/2012  Patient/family informed of [facility]' ownership interest in [facility], as well as of the fact that they are under no obligation to receive care at this facility.  PASARR submitted to [employer] on 08/05/2012 PASARR number received from [employer] on 08/05/2012  FL2 transmitted to all facilities in geographic area requested by pt/family on  08/05/2012 FL2 transmitted to all facilities within larger geographic area on   Patient informed that his/her managed care company has contracts with or will negotiate with  certain facilities, including the following:     Patient/family informed of bed offers received:   Patient chooses bed at  Physician recommends and patient chooses bed at    Patient to be transferred to  on   Patient to be transferred to facility by   The following physician request were entered in Epic:   Additional Comments:  Providence Crosby, Theresia Majors Clinical Social Work [phone]

## 2012-08-05 NOTE — Progress Notes (Signed)
Pt still agitated and making several attempts to get out of the bed even with restraints. MD notified, new orders for IV ativan written and initiated.

## 2012-08-06 LAB — BASIC METABOLIC PANEL
BUN: 29 mg/dL — ABNORMAL HIGH (ref 6–23)
Creatinine, Ser: 1.18 mg/dL (ref 0.50–1.35)
GFR calc Af Amer: 59 mL/min — ABNORMAL LOW (ref >90)
GFR calc non Af Amer: 51 mL/min — ABNORMAL LOW (ref >90)
Glucose, Bld: 79 mg/dL (ref 70–99)

## 2012-08-06 NOTE — Progress Notes (Signed)
[facility] PROGRESS NOTE  Howard Freeman ZOX:[id number] DOB: 22-Aug-1917 DOA: 08/03/2012 PCP: Sonda Primes, MD  Assessment/Plan: 1. ARF/Dehydration -improved, creatinine back to baseline -due to poor PO intake -hold ARB -stop IVF  2. Dementia with agitation and sundowning -a little better today -continue namenda, rivastigmine -continue risperdone QPM, started 6/21  3. HTN -continue amliodipine -hydralazine PRN  4. PCM-severe RD consult noted -will cut down ensure due to diarrhea  5. Diarrhea: -Cdiff PCR negative -could be malabsorption, will cut down Ensure  Failure to thrive PT/OT  Code Status: FULL Family Communication: none at bedside Disposition Plan:  needs SNF, CSW following, Monday if stable   HPI/Subjective: Less agitated overnight  Objective: Filed Vitals:   08/05/12 0813 08/05/12 1440 08/05/12 2200 08/06/12 0600  BP: 162/118 128/83 157/76 121/75  Pulse:  87 89 93  Temp:  97.6 F (36.4 C) 98 F (36.7 C) 98.8 F (37.1 C)  TempSrc:  Oral Oral Oral  Resp:  18 18 18   Height:      Weight:      SpO2:  96% 97% 98%    Intake/Output Summary (Last 24 hours) at 08/06/12 1022 Last data filed at 08/06/12 0835  Gross per 24 hour  Intake 1566.25 ml  Output      0 ml  Net 1566.25 ml   Filed Weights   08/04/12 1000  Weight: 55.747 kg (122 lb 14.4 oz)    Exam:   General:  Alert, awake, oriented to self and to place  Cardiovascular: S1S2/RRR  Respiratory:CTAB  Abdomen: soft, NT, BS present  Musculoskeletal: no edema c/c   Data Reviewed: Basic Metabolic Panel:  Recent Labs Lab 08/03/12 1900 08/03/12 2019 08/04/12 0520 08/05/12 0550 08/06/12 0528  NA 149*  --  147* 148* 142  K 5.6* 3.8 4.2 3.8 3.9  CL 106  --  109 113* 106  CO2 26  --  29 28 26   GLUCOSE 126*  --  154* 112* 79  BUN 65*  --  55* 40* 29*  CREATININE 2.24*  --  1.90* 1.45* 1.18  CALCIUM 9.6  --  8.9 8.7 8.3*   Liver Function Tests:  Recent Labs Lab  08/03/12 1900  AST 35  ALT 20  ALKPHOS 54  BILITOT 0.7  PROT 7.6  ALBUMIN 3.3*   No results found for this basename: LIPASE, AMYLASE,  in the last 168 hours No results found for this basename: AMMONIA,  in the last 168 hours CBC:  Recent Labs Lab 08/03/12 1940 08/05/12 0550  WBC 9.5 8.7  NEUTROABS 7.5  --   HGB 16.9 14.4  HCT 49.8 45.7  MCV 92.6 92.7  PLT 182 140*   Cardiac Enzymes:  Recent Labs Lab 08/03/12 1900  CKTOTAL 358*  TROPONINI <0.30   BNP (last 3 results) No results found for this basename: PROBNP,  in the last 8760 hours CBG: No results found for this basename: GLUCAP,  in the last 168 hours  Recent Results (from the past 240 hour(s))  CLOSTRIDIUM DIFFICILE BY PCR     Status: None   Collection Time    08/05/12  9:53 AM      Result Value Range Status   C difficile by pcr NEGATIVE  NEGATIVE Final     Studies: No results found.  Scheduled Meds: . amLODipine  10 mg Oral Daily  . enoxaparin (LOVENOX) injection  30 mg Subcutaneous QHS  . FLUoxetine  40 mg Oral Daily  . memantine  10 mg  Oral BID  . risperiDONE  0.5 mg Oral Q2200  . rivastigmine  6 mg Oral BID   Continuous Infusions:    Principal Problem:   Acute kidney injury Active Problems:   ANXIETY   DEPRESSION   TREMOR, ESSENTIAL   HYPERTENSION   OSTEOARTHRITIS   PROSTATE CANCER, HX OF   GASTRIC ULCER, HX OF   Alzheimer's disease   Failure to thrive in adult   Protein-calorie malnutrition, severe    Time spent:    [facility]  [facility] Pager [phone]. If 7PM-7AM, please contact night-coverage at [email or web address], password [facility] 08/06/2012, 10:22 AM  LOS: 3 days

## 2012-08-07 DIAGNOSIS — F411 Generalized anxiety disorder: Secondary | ICD-10-CM

## 2012-08-07 NOTE — Progress Notes (Signed)
[facility] PROGRESS NOTE  DELOIS TOLBERT VWU:[id number] DOB: 1917/06/14 DOA: 08/03/2012 PCP: Sonda Primes, MD  Assessment/Plan: 1. ARF/Dehydration -improved, creatinine back to baseline -due to poor PO intake -hold ARB -stopped IVF yesterday  2. Dementia with agitation and sundowning -a little better today -continue namenda, rivastigmine -continue risperdone QPM, started 6/21 -Dc restraints  3. HTN -continue amliodipine -hydralazine PRN  4. PCM-severe RD consult noted -will cut down ensure due to diarrhea  5. Diarrhea: -improved -Cdiff PCR negative -could be malabsorption, will cut down Ensure  Failure to thrive PT/OT  Code Status: FULL Family Communication: called and d/w daughter Disposition Plan:  needs SNF, CSW following, Monday if stable   HPI/Subjective: Less agitated overnight, eating well, has waist restraints  Objective: Filed Vitals:   08/06/12 1029 08/06/12 1500 08/06/12 2200 08/07/12 0600  BP: 125/81 158/75 159/85 138/77  Pulse:  98 82 77  Temp:  98.6 F (37 C) 97.6 F (36.4 C) 97.8 F (36.6 C)  TempSrc:  Oral Oral Oral  Resp:  18 18 18   Height:      Weight:      SpO2:  98% 98% 96%    Intake/Output Summary (Last 24 hours) at 08/07/12 1051 Last data filed at 08/07/12 0848  Gross per 24 hour  Intake    420 ml  Output    400 ml  Net     20 ml   Filed Weights   08/04/12 1000  Weight: 55.747 kg (122 lb 14.4 oz)    Exam:   General:  Alert, awake, oriented to self and to place  Cardiovascular: S1S2/RRR  Respiratory:CTAB  Abdomen: soft, NT, BS present  Musculoskeletal: no edema c/c   Data Reviewed: Basic Metabolic Panel:  Recent Labs Lab 08/03/12 1900 08/03/12 2019 08/04/12 0520 08/05/12 0550 08/06/12 0528  NA 149*  --  147* 148* 142  K 5.6* 3.8 4.2 3.8 3.9  CL 106  --  109 113* 106  CO2 26  --  29 28 26   GLUCOSE 126*  --  154* 112* 79  BUN 65*  --  55* 40* 29*  CREATININE 2.24*  --  1.90* 1.45* 1.18   CALCIUM 9.6  --  8.9 8.7 8.3*   Liver Function Tests:  Recent Labs Lab 08/03/12 1900  AST 35  ALT 20  ALKPHOS 54  BILITOT 0.7  PROT 7.6  ALBUMIN 3.3*   No results found for this basename: LIPASE, AMYLASE,  in the last 168 hours No results found for this basename: AMMONIA,  in the last 168 hours CBC:  Recent Labs Lab 08/03/12 1940 08/05/12 0550  WBC 9.5 8.7  NEUTROABS 7.5  --   HGB 16.9 14.4  HCT 49.8 45.7  MCV 92.6 92.7  PLT 182 140*   Cardiac Enzymes:  Recent Labs Lab 08/03/12 1900  CKTOTAL 358*  TROPONINI <0.30   BNP (last 3 results) No results found for this basename: PROBNP,  in the last 8760 hours CBG: No results found for this basename: GLUCAP,  in the last 168 hours  Recent Results (from the past 240 hour(s))  CLOSTRIDIUM DIFFICILE BY PCR     Status: None   Collection Time    08/05/12  9:53 AM      Result Value Range Status   C difficile by pcr NEGATIVE  NEGATIVE Final     Studies: No results found.  Scheduled Meds: . amLODipine  10 mg Oral Daily  . enoxaparin (LOVENOX) injection  30 mg Subcutaneous  QHS  . FLUoxetine  40 mg Oral Daily  . memantine  10 mg Oral BID  . risperiDONE  0.5 mg Oral Q2200  . rivastigmine  6 mg Oral BID   Continuous Infusions:    Principal Problem:   Acute kidney injury Active Problems:   ANXIETY   DEPRESSION   TREMOR, ESSENTIAL   HYPERTENSION   OSTEOARTHRITIS   PROSTATE CANCER, HX OF   GASTRIC ULCER, HX OF   Alzheimer's disease   Failure to thrive in adult   Protein-calorie malnutrition, severe    Time spent:    [facility]  [facility] Pager [phone]. If 7PM-7AM, please contact night-coverage at [email or web address], password [facility] 08/07/2012, 10:51 AM  LOS: 4 days

## 2012-08-07 NOTE — Progress Notes (Signed)
CSW provided patient's daughter with bed offers. Discussed discharge planning and offered emotional support.  Marino Rogerson C. Shammond Arave MSW, LCSW [phone]

## 2012-08-07 NOTE — Progress Notes (Signed)
CSW spoke with patient's daughter. She would like to meet with CSW at 2pm. CSW set up appointment with her.  Shalea Tomczak C. Aylani Spurlock MSW, LCSW [phone]

## 2012-08-07 NOTE — Progress Notes (Signed)
Physical Therapy Treatment Patient Details Name: Howard Freeman MRN: [id number] DOB: 10-11-1917 Today's Date: 08/07/2012 Time: 4098-1191 PT Time Calculation (min): 24 min  PT Assessment / Plan / Recommendation Comments on Treatment Session  Participating well with sessions.     Follow Up Recommendations  SNF;Supervision/Assistance - 24 hour     Does the patient have the potential to tolerate intense rehabilitation     Barriers to Discharge        Equipment Recommendations  Rolling walker with 5" wheels    Recommendations for Other Services OT consult  Frequency Min 3X/week   Plan Discharge plan remains appropriate    Precautions / Restrictions Precautions Precautions: Fall Precaution Comments: demenita, decreased safety awareness Restrictions Weight Bearing Restrictions: No   Pertinent Vitals/Pain No c/o pain    Mobility  Bed Mobility Bed Mobility: Supine to Sit;Sit to Supine Supine to Sit: 6: Modified independent (Device/Increase time) Sit to Supine: 6: Modified independent (Device/Increase time) Transfers Transfers: Sit to Stand;Stand to Sit Sit to Stand: 4: Min assist;From bed Stand to Sit: 4: Min assist;To bed Details for Transfer Assistance: Assist to rise, stabilize, control descent Ambulation/Gait Ambulation/Gait Assistance: 4: Min assist Ambulation Distance (Feet): 160 Feet Assistive device: Rolling walker Ambulation/Gait Assistance Details: Assist to stabilize throughout ambulation and maneuver with RW intermittently. Multimodal cues for safety, distance from rW.  Gait Pattern: Trunk flexed;Decreased stride length;Step-through pattern    Exercises General Exercises - Lower Extremity Ankle Circles/Pumps: AROM;Both;10 reps;Seated Long Arc Quad: AROM;Both;10 reps;Seated Hip Flexion/Marching: AROM;Both;10 reps;Seated   PT Diagnosis:    PT Problem List:   PT Treatment Interventions:     PT Goals Acute Rehab PT Goals Pt will go Sit to Stand: with  supervision PT Goal: Sit to Stand - Progress: Progressing toward goal Pt will Ambulate: 51 - 150 feet;with supervision;with least restrictive assistive device PT Goal: Ambulate - Progress: Progressing toward goal  Visit Information  Last PT Received On: 08/07/12 Assistance Needed: +1    Subjective Data  Subjective: This is the first walk [name] been on since Ive been here Patient Stated Goal: none stated   Cognition  Cognition Arousal/Alertness: Awake/alert Behavior During Therapy: WFL for tasks assessed/performed Overall Cognitive Status: No family/caregiver present to determine baseline cognitive functioning Area of Impairment: Memory;Orientation;Safety/judgement Orientation Level: Place;Time;Situation Memory: Decreased short-term memory Safety/Judgement: Decreased awareness of deficits    Balance     End of Session PT - End of Session Equipment Utilized During Treatment: Gait belt Activity Tolerance: Patient tolerated treatment well Patient left: in bed;with call bell/phone within reach;with bed alarm set   GP     Rebeca Alert, MPT Pager: [phone]

## 2012-08-08 DIAGNOSIS — F329 Major depressive disorder, single episode, unspecified: Secondary | ICD-10-CM

## 2012-08-08 DIAGNOSIS — F3289 Other specified depressive episodes: Secondary | ICD-10-CM

## 2012-08-08 MED ORDER — AMLODIPINE BESYLATE 10 MG PO TABS: 10.0000 mg | ORAL_TABLET | Freq: Every day | ORAL | Status: AC

## 2012-08-08 MED ORDER — RISPERIDONE 0.5 MG PO TABS: 0.5000 mg | ORAL_TABLET | Freq: Every day | ORAL | Status: AC

## 2012-08-08 NOTE — Progress Notes (Signed)
Gave report to [employer], Charity fundraiser at [employer] number to call if had any additional questions.

## 2012-08-08 NOTE — Clinical Social Work Placement (Signed)
     Clinical Social Work Department CLINICAL SOCIAL WORK PLACEMENT NOTE 08/08/2012  Patient:  Howard Freeman, Howard Freeman  Account Number:  [id number] Admit date:  08/03/2012  Clinical Social Worker:  Doroteo Glassman  Date/time:  08/05/2012 01:02 PM  Clinical Social Work is seeking post-discharge placement for this patient at the following level of care:   SKILLED NURSING   (*CSW will update this form in Epic as items are completed)   08/05/2012  Patient/family provided with [facility] Department of Clinical Social Works list of facilities offering this level of care within the geographic area requested by the patient (or if unable, by the patients family).  08/05/2012  Patient/family informed of their freedom to choose among providers that offer the needed level of care, that participate in Medicare, Medicaid or managed care program needed by the patient, have an available bed and are willing to accept the patient.  08/05/2012  Patient/family informed of [facility] ownership interest in [facility], as well as of the fact that they are under no obligation to receive care at this facility.  PASARR submitted to EDS on 08/05/2012 PASARR number received from [employer] on 08/05/2012  FL2 transmitted to all facilities in geographic area requested by pt/family on  08/05/2012 FL2 transmitted to all facilities within larger geographic area on   Patient informed that his/her managed care company has contracts with or will negotiate with  certain facilities, including the following:     Patient/family informed of bed offers received:  08/08/2012 Patient chooses bed at [facility] Physician recommends and patient chooses bed at    Patient to be transferred to [facility] on  08/08/2012 Patient to be transferred to facility by ptar  The following physician request were entered in Epic:   Additional Comments:

## 2012-08-08 NOTE — Discharge Summary (Signed)
Physician Discharge Summary  Howard Freeman JXB:[id number] DOB: 1917/08/10 DOA: 08/03/2012  PCP: Sonda Primes, MD  Admit date: 08/03/2012 Discharge date: 08/08/2012  Time spent: 45 minutes  Recommendations for Outpatient Follow-up:  1. PCP in 1 week  Discharge Diagnoses:  Principal Problem:   Acute kidney injury Active Problems:   ANXIETY   DEPRESSION   TREMOR, ESSENTIAL   HYPERTENSION   OSTEOARTHRITIS   PROSTATE CANCER, HX OF   GASTRIC ULCER, HX OF   Alzheimer's disease   Failure to thrive in adult   Protein-calorie malnutrition, severe   Discharge Condition:stable  Diet recommendation: low sodium  Filed Weights   08/04/12 1000  Weight: 55.747 kg (122 lb 14.4 oz)    History of present illness:  Patient is 79 year old man with PMH most significant for alzheimer's dementia and HTN who lives alone. His family checked on him after 3 days and found him in pool of feces and decided to bring him to the hospital.  Patient denies any complaints to me except inability to eat anything as he is not hungry anymore.  He denied chest pain, shortness of breath, problems with urination, changes in bowel movements or weakness in any part of the body.   Hospital Course:    1. ARF/Dehydration -improved, creatinine back to baseline  -due to poor PO intake, dehydration, ARB -hold ARB  -stopped IVF 2 days ago  2. Dementia with agitation and sundowning  -improved -continue namenda, rivastigmine  -continue risperdone QPM, started 6/21  -was on restraints 2 days ago, since discontinued   3. HTN  -continue amliodipine  -ARB Dced  4. PCM-severe  RD consult noted  -will cut down ensure due to diarrhea   5. Diarrhea:  -improved  -Cdiff PCR negative  -could be malabsorption, will cut down Ensure     Discharge Exam: Filed Vitals:   08/07/12 1253 08/07/12 1427 08/07/12 2200 08/08/12 0700  BP: 142/80 126/72 128/85 132/72  Pulse: 74 93 86 89  Temp: 98.2 F (36.8 C) 98.3  F (36.8 C) 98.2 F (36.8 C) 98.4 F (36.9 C)  TempSrc: Oral Oral Oral Oral  Resp: 20 18 18 18   Height:      Weight:      SpO2: 98% 90% 95% 96%    General: Alert, awake, oriented to self, place Cardiovascular: S1S2/RRR Respiratory: CTAB  Discharge Instructions  Discharge Orders   Future Orders Complete By Expires     Diet - low sodium heart healthy  As directed     Increase activity slowly  As directed         Medication List    STOP taking these medications       AZOR 5-20 MG per tablet  Generic drug:  amLODipine-olmesartan      TAKE these medications       ALPRAZolam 0.25 MG tablet  Commonly known as:  XANAX  Take 0.25 mg by mouth 2 (two) times daily as needed for sleep (ANXIETY).     amLODipine 10 MG tablet  Commonly known as:  NORVASC  Take 1 tablet (10 mg total) by mouth daily.     b complex vitamins tablet  Take 1 tablet by mouth daily.     FLUoxetine 20 MG tablet  Commonly known as:  PROZAC  Take 40 mg by mouth daily.     memantine 10 MG tablet  Commonly known as:  NAMENDA  Take 10 mg by mouth 2 (two) times daily.  risperiDONE 0.5 MG tablet  Commonly known as:  RISPERDAL  Take 1 tablet (0.5 mg total) by mouth daily at 10 pm.     rivastigmine 6 MG capsule  Commonly known as:  EXELON  Take 6 mg by mouth 2 (two) times daily.     Vitamin D-3 1000 UNITS Caps  Take 2,000 Units by mouth daily.       Not on File     Follow-up Information   Follow up with Sonda Primes, MD In 1 week.   Contact information:   [address] [place] [address] [phone]        The results of significant diagnostics from this hospitalization (including imaging, microbiology, ancillary and laboratory) are listed below for reference.    Significant Diagnostic Studies: Dg Chest 2 View  08/03/2012   *RADIOLOGY REPORT*  Clinical Data: Altered mental status  CHEST - 2 VIEW  Comparison: Prior chest x-ray 04/22/2012  Findings:  No pulmonary edema, focal airspace consolidation, large effusion or pneumothorax.  The suggestion of patchy opacity in the right lung bases favored to reflect the overlying bedding.  Stable cardiac and mediastinal contours with ectatic and tortuous thoracic aorta.  Calcified mediastinal and right hilar nodes are again noted.  Central bronchitic changes are similar to prior.  No acute osseous abnormality.  IMPRESSION:  1.  No acute cardiopulmonary disease. 2.  Ectatic and highly tortuous thoracic aorta.   Original Report Authenticated By: Malachy Moan, M.D.   Ct Head Wo Contrast  08/03/2012   *RADIOLOGY REPORT*  Clinical Data: Demented, altered level of consciousness  CT HEAD WITHOUT CONTRAST  Technique:  Contiguous axial images were obtained from the base of the skull through the vertex without contrast.  Comparison: None.  Findings: Atherosclerotic and physiologic intracranial calcifications.  Encephalomalacia in right frontal white matter. Diffuse parenchymal atrophy. Patchy areas of hypoattenuation in deep and periventricular white matter bilaterally. Negative for acute intracranial hemorrhage, mass lesion, acute infarction, midline shift, or mass-effect. Acute infarct may be inapparent on noncontrast CT. Ventricles and sulci symmetric. Bone windows demonstrate no focal lesion.  IMPRESSION:  1. Negative for bleed or other acute intracranial process.  2. Atrophy and nonspecific white matter changes   Original Report Authenticated By: D. Andria Rhein, MD    Microbiology: Recent Results (from the past 240 hour(s))  CLOSTRIDIUM DIFFICILE BY PCR     Status: None   Collection Time    08/05/12  9:53 AM      Result Value Range Status   C difficile by pcr NEGATIVE  NEGATIVE Final     Labs: Basic Metabolic Panel:  Recent Labs Lab 08/03/12 1900 08/03/12 2019 08/04/12 0520 08/05/12 0550 08/06/12 0528  NA 149*  --  147* 148* 142  K 5.6* 3.8 4.2 3.8 3.9  CL 106  --  109 113* 106  CO2 26  --  29  28 26   GLUCOSE 126*  --  154* 112* 79  BUN 65*  --  55* 40* 29*  CREATININE 2.24*  --  1.90* 1.45* 1.18  CALCIUM 9.6  --  8.9 8.7 8.3*   Liver Function Tests:  Recent Labs Lab 08/03/12 1900  AST 35  ALT 20  ALKPHOS 54  BILITOT 0.7  PROT 7.6  ALBUMIN 3.3*   No results found for this basename: LIPASE, AMYLASE,  in the last 168 hours No results found for this basename: AMMONIA,  in the last 168 hours CBC:  Recent Labs Lab 08/03/12 1940 08/05/12 0550  WBC 9.5 8.7  NEUTROABS 7.5  --   HGB 16.9 14.4  HCT 49.8 45.7  MCV 92.6 92.7  PLT 182 140*   Cardiac Enzymes:  Recent Labs Lab 08/03/12 1900  CKTOTAL 358*  TROPONINI <0.30   BNP: BNP (last 3 results) No results found for this basename: PROBNP,  in the last 8760 hours CBG: No results found for this basename: GLUCAP,  in the last 168 hours     Signed:  Betina Puckett  [facility] 08/08/2012, 11:20 AM

## 2012-08-16 ENCOUNTER — Ambulatory Visit: Payer: No Typology Code available for payment source | Admitting: Internal Medicine

## 2012-08-28 DIAGNOSIS — B351 Tinea unguium: Secondary | ICD-10-CM | POA: Insufficient documentation

## 2012-10-30 ENCOUNTER — Encounter: Payer: Self-pay | Admitting: Podiatry

## 2012-10-30 DIAGNOSIS — B351 Tinea unguium: Secondary | ICD-10-CM

## 2012-10-30 DIAGNOSIS — F419 Anxiety disorder, unspecified: Secondary | ICD-10-CM | POA: Insufficient documentation

## 2012-11-27 ENCOUNTER — Encounter: Payer: Self-pay | Admitting: Podiatry

## 2012-11-27 ENCOUNTER — Ambulatory Visit (INDEPENDENT_AMBULATORY_CARE_PROVIDER_SITE_OTHER): Payer: Medicare Other | Admitting: Podiatry

## 2012-11-27 VITALS — BP 115/69 | HR 89 | Resp 18

## 2012-11-27 DIAGNOSIS — B351 Tinea unguium: Secondary | ICD-10-CM

## 2012-11-27 DIAGNOSIS — M79609 Pain in unspecified limb: Secondary | ICD-10-CM

## 2012-11-27 NOTE — Progress Notes (Signed)
  Subjective:    Patient ID: Howard Freeman, male    DOB: 02-Feb-1918, 79 y.o.   MRN: [id number]  HPI trim my nails, thick and long and curling in and discolored    Review of Systems check positive for anxiety hypertension and ulcers.    Objective:   Physical Exam Hypertrophic elongated toenails with texture and color changes x10 noted. Dry flaking skin noted on the feet bilaterally.       Assessment & Plan:  Symptomatic onychomycoses x10 Xerosis of the feet bilaterally.  All 10 toenails are debrided back with the bleeding. Skin lotion recommended to apply the feet on a daily basis.  Reappoint at three-month intervals.  Kamaron Deskins C.Leeanne Deed, DPM

## 2012-11-27 NOTE — Patient Instructions (Signed)
Apply skin lotion to feet once a day

## 2013-02-26 ENCOUNTER — Ambulatory Visit: Payer: PRIVATE HEALTH INSURANCE | Admitting: Podiatry

## 2013-03-19 ENCOUNTER — Encounter: Payer: Self-pay | Admitting: Podiatry

## 2013-03-19 ENCOUNTER — Ambulatory Visit (INDEPENDENT_AMBULATORY_CARE_PROVIDER_SITE_OTHER): Payer: Medicare Other | Admitting: Podiatry

## 2013-03-19 VITALS — BP 113/74 | HR 94 | Resp 18

## 2013-03-19 DIAGNOSIS — B351 Tinea unguium: Secondary | ICD-10-CM

## 2013-03-19 DIAGNOSIS — M79609 Pain in unspecified limb: Secondary | ICD-10-CM

## 2013-03-19 NOTE — Progress Notes (Signed)
° °  Subjective:    Patient ID: Howard Freeman, male    DOB: 1918/01/01, 80 y.o.   MRN: [id number]  HPI I need my toenails trimmed up    Review of Systems     Objective:   Physical Exam Dermatological: Hypertrophic, elongated, brittle toenails x10. Dry scaling skin noted ankle seat bilaterally        Assessment & Plan:  Assessment: Symptomatic onychomycoses x10 Xerosis  Bilaterally  Plan: Nails x10 debrided back without a bleeding. Advised patient to use all-purpose skin lotion on his feet daily.  Reappoint x3 months.

## 2013-03-19 NOTE — Patient Instructions (Signed)
Return every 3 months for debridement of mycotic toenails. Apply all-purpose skin lotion to the right and left feet daily.

## 2013-06-18 ENCOUNTER — Ambulatory Visit: Payer: Medicare Other | Admitting: Podiatry

## 2013-07-23 ENCOUNTER — Encounter: Payer: Self-pay | Admitting: Podiatry

## 2013-07-23 ENCOUNTER — Ambulatory Visit (INDEPENDENT_AMBULATORY_CARE_PROVIDER_SITE_OTHER): Payer: Medicare Other | Admitting: Podiatry

## 2013-07-23 VITALS — BP 123/78 | HR 97 | Resp 18

## 2013-07-23 DIAGNOSIS — M79609 Pain in unspecified limb: Secondary | ICD-10-CM | POA: Diagnosis not present

## 2013-07-23 DIAGNOSIS — B351 Tinea unguium: Secondary | ICD-10-CM | POA: Diagnosis not present

## 2013-07-23 NOTE — Progress Notes (Signed)
Patient ID: Howard Freeman, male   DOB: August 13, 1917, 80 y.o.   MRN: [id number]  Subjective: 80 year old black male presents with family members requesting debridement of toenails  Objective: Patient transferred from wheelchair to treatment chair Hypertrophic, brittle, discolored, incurvated toenails x10  Assessment: Symptomatic onychomycoses x10  Plan: Debrided toenails x10 without a bleeding  Reappoint x3 months

## 2013-10-29 ENCOUNTER — Ambulatory Visit (INDEPENDENT_AMBULATORY_CARE_PROVIDER_SITE_OTHER): Payer: Medicare Other | Admitting: Podiatry

## 2013-10-29 ENCOUNTER — Encounter: Payer: Self-pay | Admitting: Podiatry

## 2013-10-29 VITALS — BP 126/84 | HR 88 | Resp 18

## 2013-10-29 DIAGNOSIS — B351 Tinea unguium: Secondary | ICD-10-CM

## 2013-10-29 DIAGNOSIS — M79609 Pain in unspecified limb: Secondary | ICD-10-CM

## 2013-10-29 DIAGNOSIS — M79676 Pain in unspecified toe(s): Secondary | ICD-10-CM

## 2013-10-29 NOTE — Patient Instructions (Signed)
Apply all-purpose skin lotion to feet daily 

## 2013-10-29 NOTE — Progress Notes (Signed)
Patient ID: Howard Freeman, male   DOB: April 22, 1917, 80 y.o.   MRN: [id number] Subjective: Orientated x3 black male transferred from wheelchair to treatment chair family members present.  Objective: Scaling dry skin bilaterally The toenails are hypertrophic, brittle, incurvated, discolored 6-10  Assessment: xerosis bilaterally Symptomatic onychomycoses 6-10  Plan: Advised patient to use all-purpose skin lotion daily to feet Nails x10 debrided without a bleeding  Reappoint x3 months

## 2013-12-16 IMAGING — CR DG CHEST 2V
2 series · 2 of 2 positions shown · non-contrast
Comparison: Prior chest x-ray 04/22/2012

CLINICAL DATA: Altered mental status

CHEST - 2 VIEW

[w chest lat]
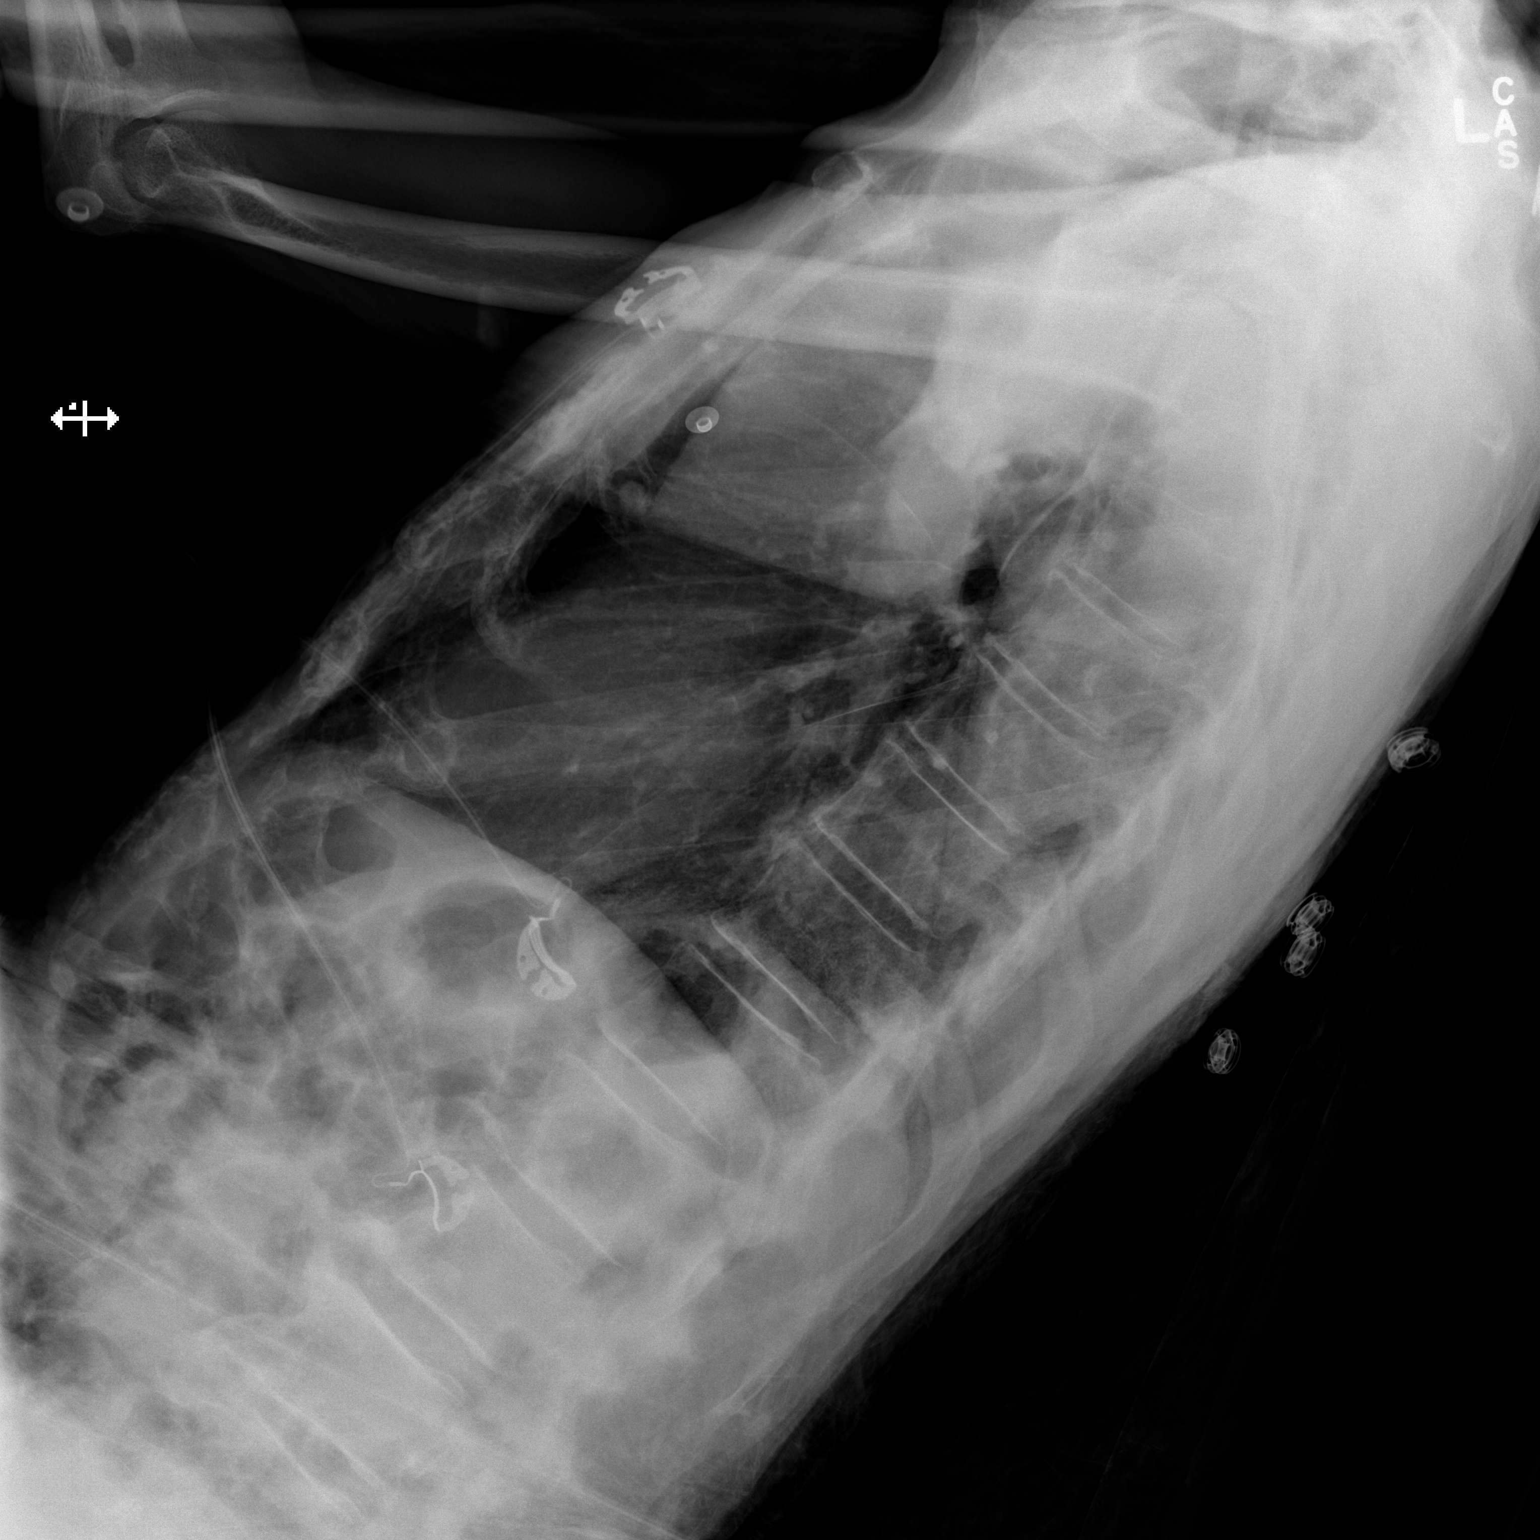

[x chest ap]
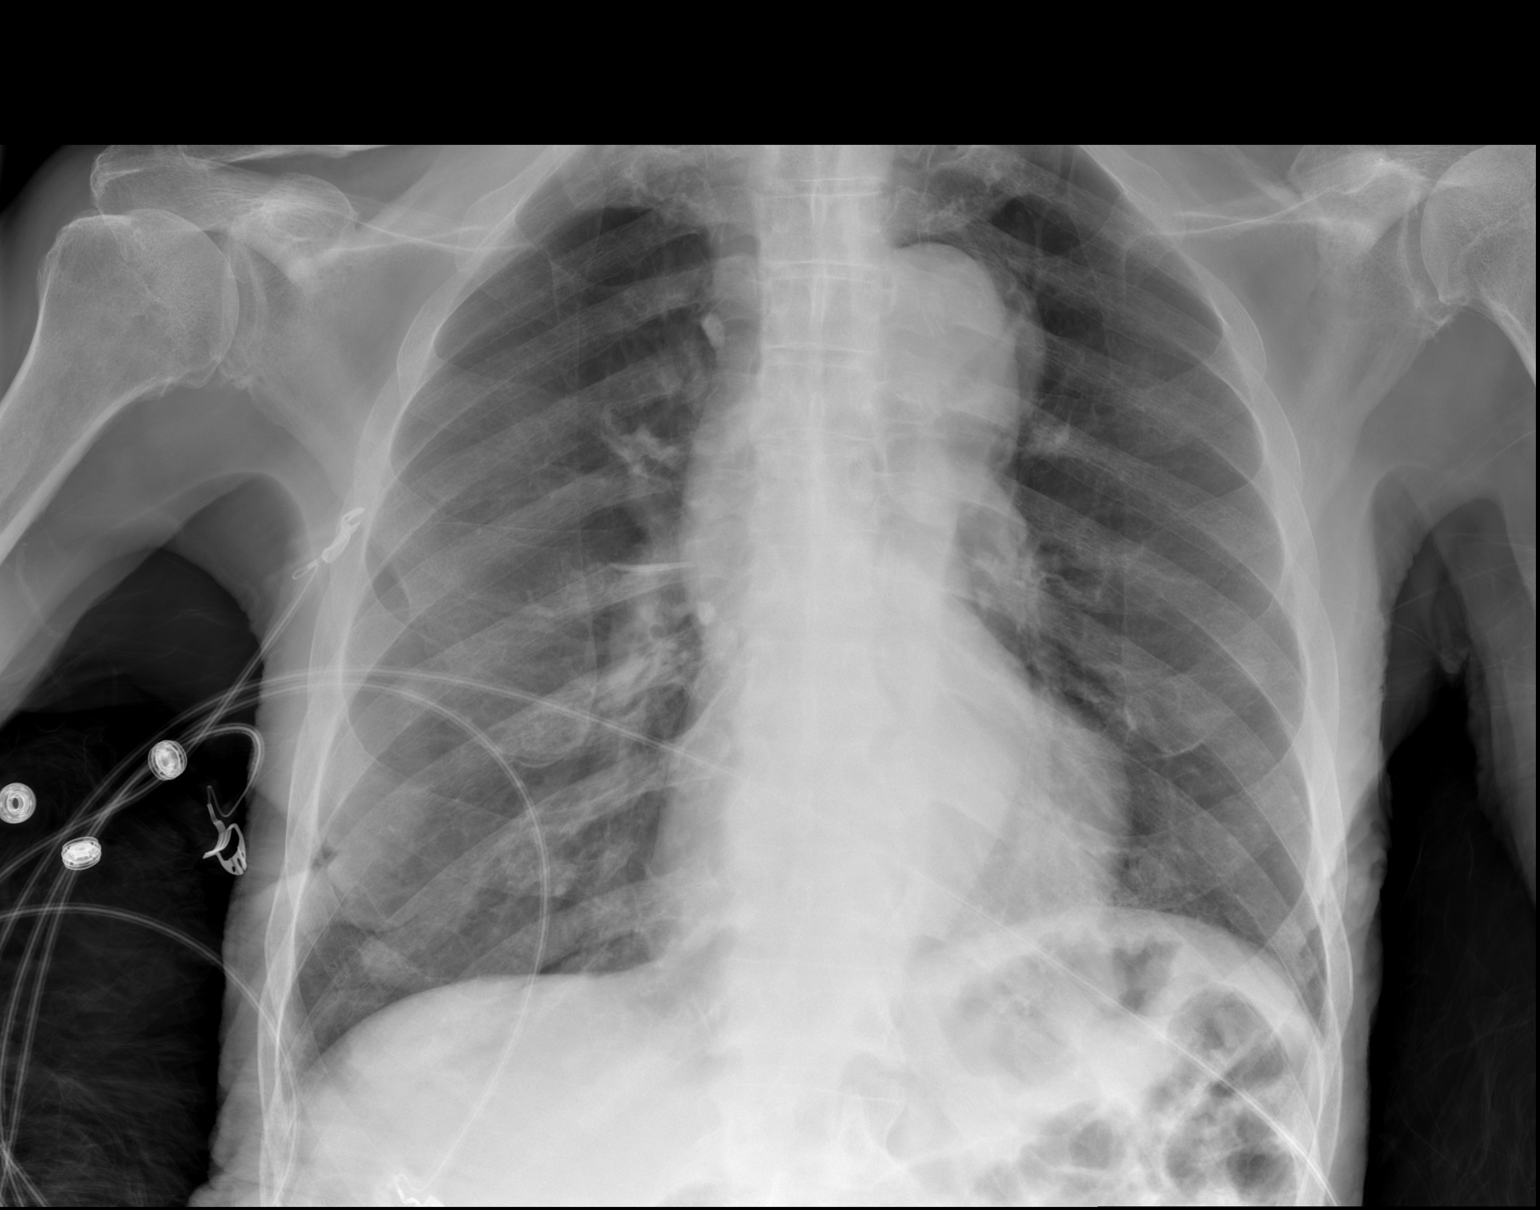

[2 of 2 positions shown; findings below may reference images not displayed]

FINDINGS: No pulmonary edema, focal airspace consolidation, large
effusion or pneumothorax.  The suggestion of patchy opacity in the
right lung bases favored to reflect the overlying bedding.  Stable
cardiac and mediastinal contours with ectatic and tortuous thoracic
aorta.  Calcified mediastinal and right hilar nodes are again
noted.  Central bronchitic changes are similar to prior.  No acute
osseous abnormality.
IMPRESSION: 1.  No acute cardiopulmonary disease.
2.  Ectatic and highly tortuous thoracic aorta.

## 2013-12-16 IMAGING — CT CT HEAD W/O CM
2 series · 17 of 30 positions shown, 20 images · non-contrast
Comparison: None.

CLINICAL DATA: Demented, altered level of consciousness

CT HEAD WITHOUT CONTRAST
TECHNIQUE: Contiguous axial images were obtained from the base of
the skull through the vertex without contrast.

[Series 2: head w/o · axial · non-contrast · 0.43mm/px · z∈[-120,-0]mm · 9 of 31 slices shown, 12 images]
[im 4/31  brain]
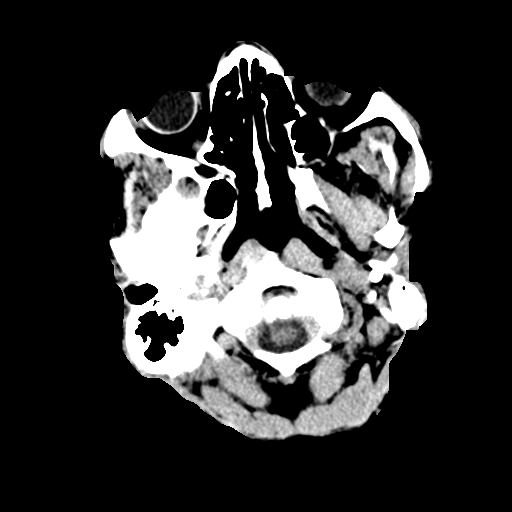
[im 4/31  bone]
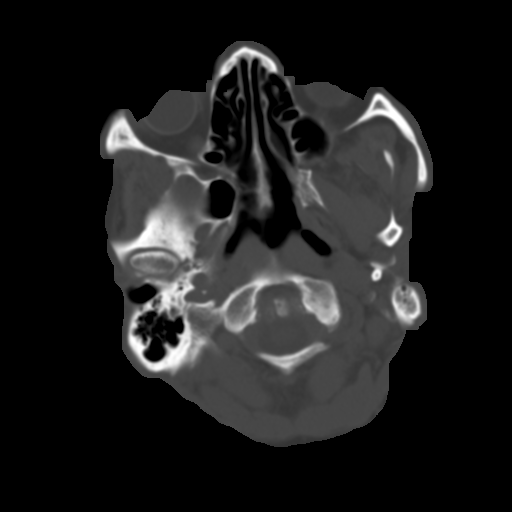
[im 7/31  brain]
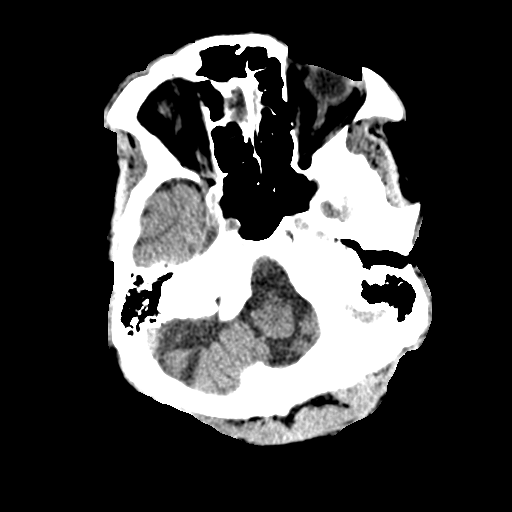
[im 10/31  brain]
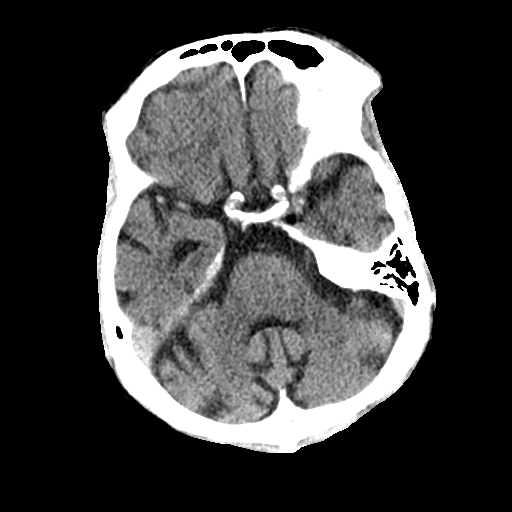
[im 13/31  brain]
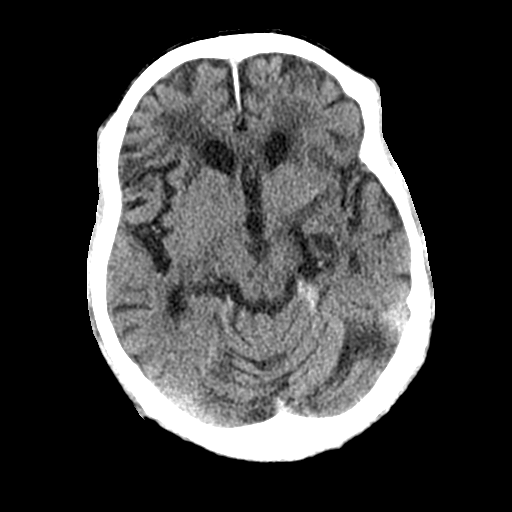
[im 16/31  brain]
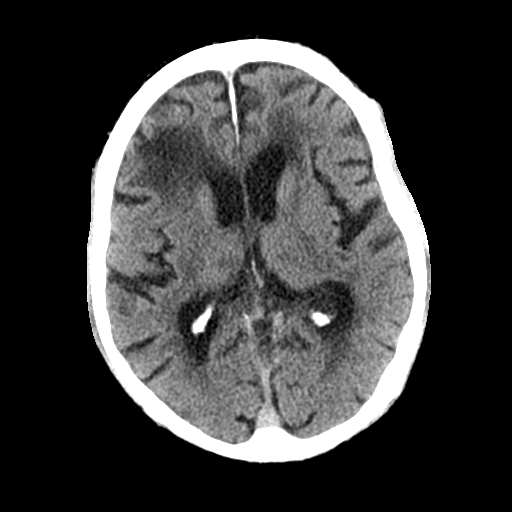
[im 16/31  bone]
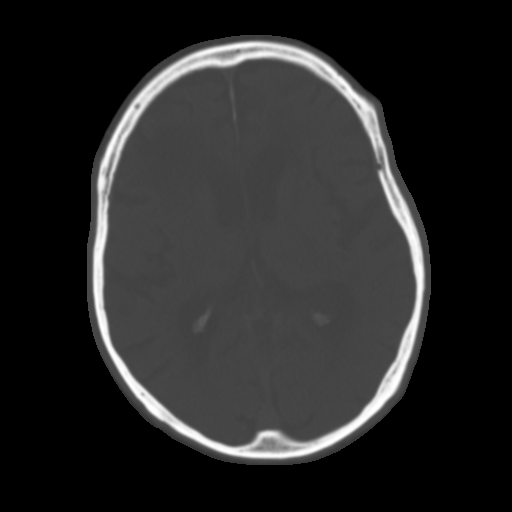
[im 19/31  brain]
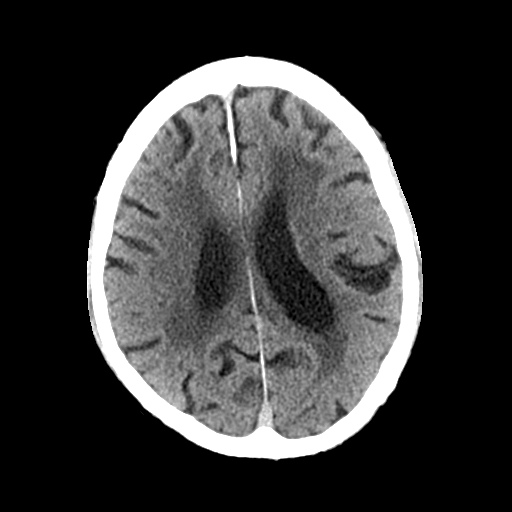
[im 22/31  brain]
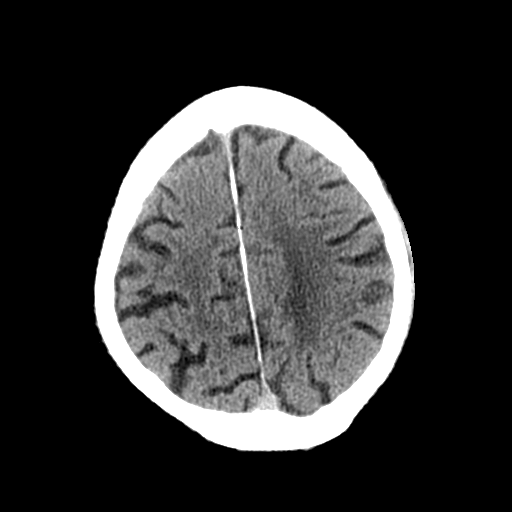
[im 25/31  brain]
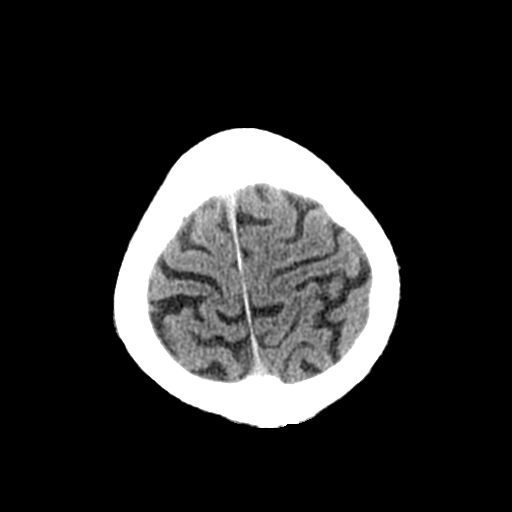
[im 28/31  brain]
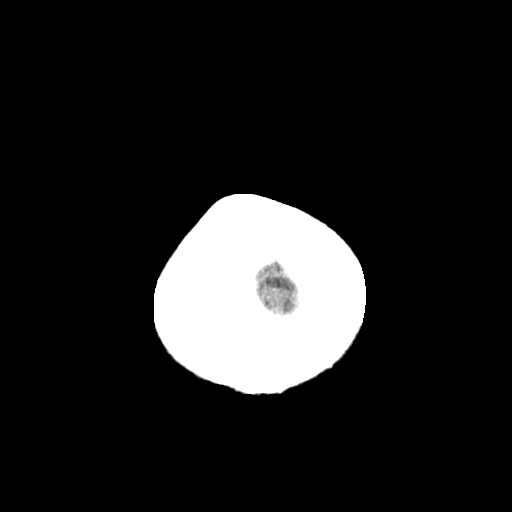
[im 28/31  bone]
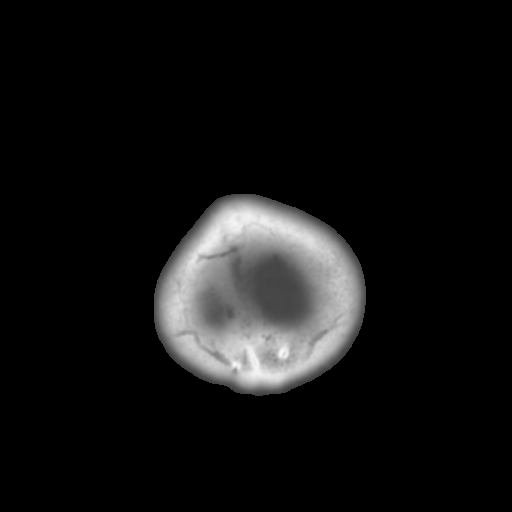

[Series 3: bone windows · axial · 0.43mm/px · z∈[-120,-3]mm · 8 of 51 slices shown]
[im 6/51  bone]
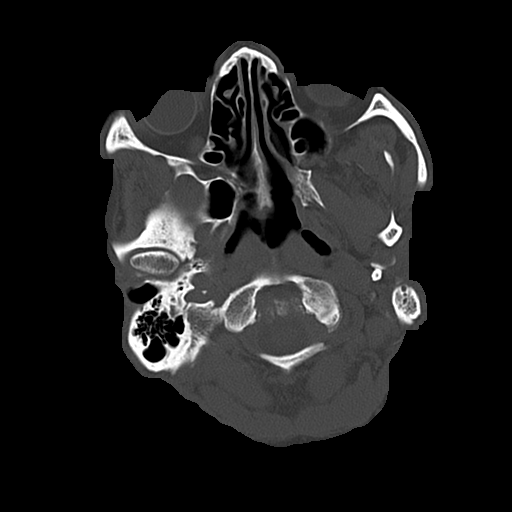
[im 12/51  bone]
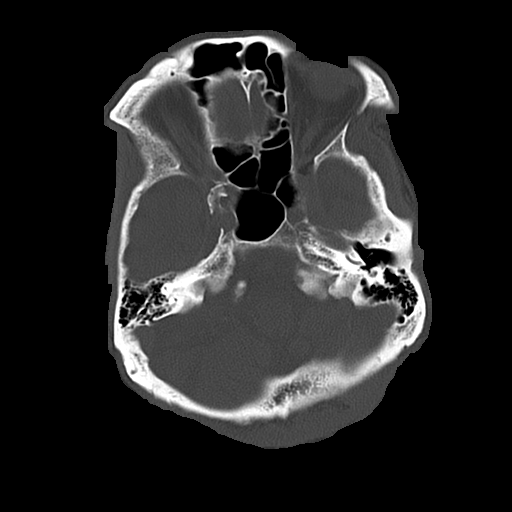
[im 17/51  bone]
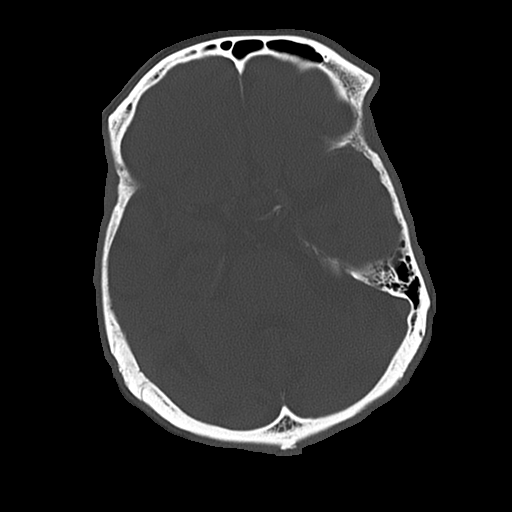
[im 23/51  bone]
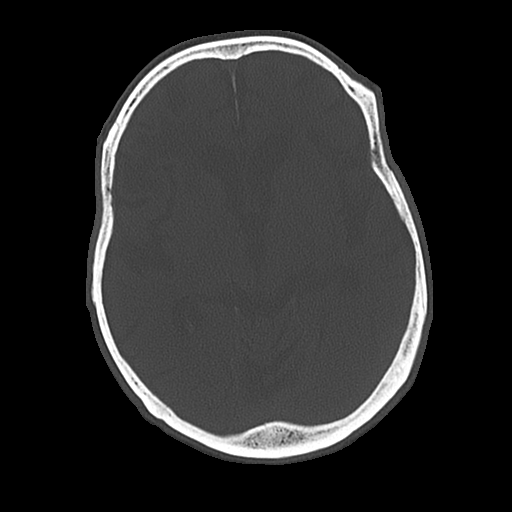
[im 28/51  bone]
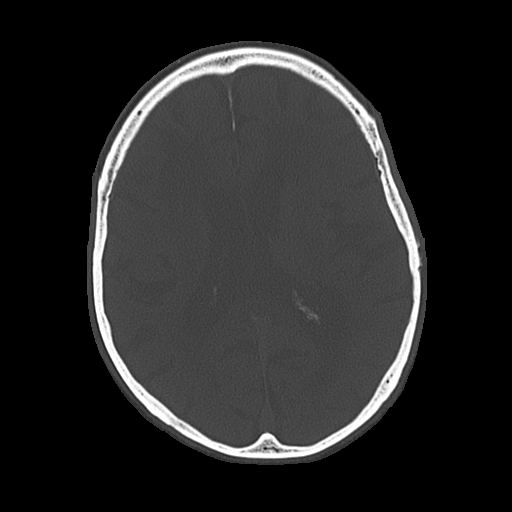
[im 34/51  bone]
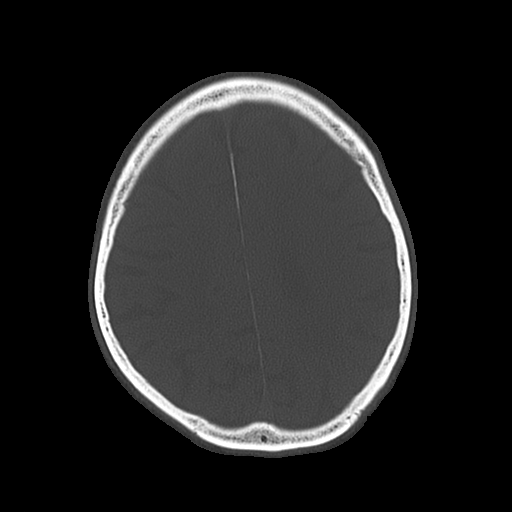
[im 39/51  bone]
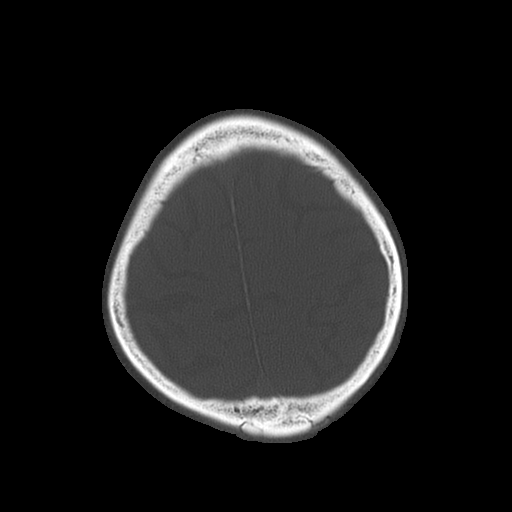
[im 45/51  bone]
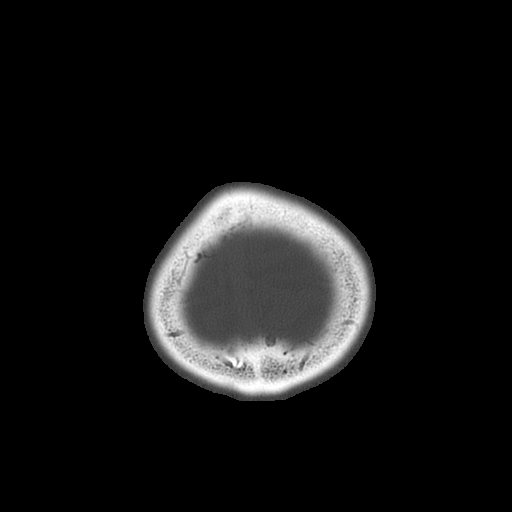

[17 of 30 positions shown; findings below may reference images not displayed]

FINDINGS: Atherosclerotic and physiologic intracranial
calcifications.  Encephalomalacia in right frontal white matter.
Diffuse parenchymal atrophy. Patchy areas of hypoattenuation in
deep and periventricular white matter bilaterally. Negative for
acute intracranial hemorrhage, mass lesion, acute infarction,
midline shift, or mass-effect. Acute infarct may be inapparent on
noncontrast CT. Ventricles and sulci symmetric. Bone windows
demonstrate no focal lesion.
IMPRESSION: 1. Negative for bleed or other acute intracranial process.

2. Atrophy and nonspecific white matter changes

## 2014-01-28 ENCOUNTER — Ambulatory Visit: Payer: Medicare Other | Admitting: Podiatry

## 2014-05-02 ENCOUNTER — Telehealth: Payer: Self-pay

## 2014-05-02 NOTE — Telephone Encounter (Signed)
Pt declined flu vaccine

## 2017-01-15 DEATH — deceased
